# Patient Record
Sex: Male | Born: 1965 | Race: White | Hispanic: No | Marital: Married | Smoking: Never smoker
Health system: Southern US, Community
[De-identification: ages and names within clinical notes are randomized; demographics above are authoritative.]

## PROBLEM LIST (undated history)

## (undated) DIAGNOSIS — N289 Disorder of kidney and ureter, unspecified: Secondary | ICD-10-CM

## (undated) DIAGNOSIS — R7303 Prediabetes: Secondary | ICD-10-CM

## (undated) DIAGNOSIS — E785 Hyperlipidemia, unspecified: Secondary | ICD-10-CM

## (undated) DIAGNOSIS — T7840XA Allergy, unspecified, initial encounter: Secondary | ICD-10-CM

## (undated) DIAGNOSIS — E7800 Pure hypercholesterolemia, unspecified: Secondary | ICD-10-CM

## (undated) DIAGNOSIS — Z87891 Personal history of nicotine dependence: Secondary | ICD-10-CM

## (undated) DIAGNOSIS — I10 Essential (primary) hypertension: Secondary | ICD-10-CM

## (undated) HISTORY — DX: Allergy, unspecified, initial encounter: T78.40XA

## (undated) HISTORY — PX: HERNIA REPAIR: SHX51

## (undated) HISTORY — PX: TONSILLECTOMY: SUR1361

---

## 2012-01-21 ENCOUNTER — Emergency Department (HOSPITAL_COMMUNITY): Payer: 59

## 2012-01-21 ENCOUNTER — Other Ambulatory Visit: Payer: Self-pay

## 2012-01-21 ENCOUNTER — Emergency Department (HOSPITAL_COMMUNITY)
Admission: EM | Admit: 2012-01-21 | Discharge: 2012-01-21 | Disposition: A | Discharge status: Home / Self Care | Payer: 59 | Attending: Emergency Medicine | Admitting: Emergency Medicine

## 2012-01-21 DIAGNOSIS — R5381 Other malaise: Secondary | ICD-10-CM | POA: Insufficient documentation

## 2012-01-21 DIAGNOSIS — R110 Nausea: Secondary | ICD-10-CM

## 2012-01-21 DIAGNOSIS — R079 Chest pain, unspecified: Secondary | ICD-10-CM | POA: Insufficient documentation

## 2012-01-21 DIAGNOSIS — M25519 Pain in unspecified shoulder: Secondary | ICD-10-CM

## 2012-01-21 DIAGNOSIS — R5383 Other fatigue: Secondary | ICD-10-CM

## 2012-01-21 DIAGNOSIS — R209 Unspecified disturbances of skin sensation: Secondary | ICD-10-CM | POA: Insufficient documentation

## 2012-01-21 LAB — CBC
HCT: 48.6 % (ref 39.0–52.0)
Hemoglobin: 16.9 g/dL (ref 13.0–17.0)
MCHC: 34.8 g/dL (ref 30.0–36.0)
WBC: 8.3 10*3/uL (ref 4.0–10.5)

## 2012-01-21 LAB — BASIC METABOLIC PANEL
BUN: 10 mg/dL (ref 6–23)
Chloride: 103 mEq/L (ref 96–112)
GFR calc Af Amer: >90 mL/min (ref >90)
GFR calc non Af Amer: >90 mL/min (ref >90)
Potassium: 3.5 mEq/L (ref 3.5–5.1)
Sodium: 140 mEq/L (ref 135–145)

## 2012-01-21 LAB — DIFFERENTIAL
Basophils Absolute: 0 10*3/uL (ref 0.0–0.1)
Basophils Relative: 0 % (ref 0–1)
Lymphocytes Relative: 32 % (ref 12–46)
Monocytes Absolute: 0.5 10*3/uL (ref 0.1–1.0)
Monocytes Relative: 6 % (ref 3–12)
Neutro Abs: 5.1 10*3/uL (ref 1.7–7.7)
Neutrophils Relative %: 61 % (ref 43–77)

## 2012-01-21 LAB — TROPONIN I: Troponin I: <0.3 ng/mL (ref <0.30)

## 2012-01-21 MED ORDER — HYDROCODONE-ACETAMINOPHEN 5-325 MG PO TABS
1.0000 | ORAL_TABLET | Freq: Once | ORAL | Status: AC
  Administered 2012-01-21: 1 via ORAL
  Filled 2012-01-21: qty 1

## 2012-01-21 NOTE — ED Notes (Signed)
EKG handed to Dr. Jeraldine Loots.  Extra copy placed in pt chart.  No old EKG in [facility]

## 2012-01-21 NOTE — ED Notes (Signed)
Pt states that he has had left shoulder and chest pain since Saturday. Denies N/V and SOB. Pt received 324 of Asprin and 1 nitro en route. Pain was at a 2 prior to nitro and 0 after.

## 2012-01-21 NOTE — ED Notes (Signed)
MD notified of return of pain to pt's shoulder and neck.

## 2012-01-21 NOTE — ED Provider Notes (Signed)
History     CSN: [id number]  Arrival date & time 01/21/12  1632   First MD Initiated Contact with Patient 01/21/12 1634      Chief Complaint  Patient presents with  . Chest Pain    (Consider location/radiation/quality/duration/timing/severity/associated sxs/prior treatment) Patient is a 46 y.o. male presenting with shoulder pain. The history is provided by the patient.  Shoulder Pain This is a recurrent problem. The current episode started in the past 7 days (5 days ago). The problem occurs intermittently. The problem has been unchanged. Associated symptoms include fatigue (noticed today), nausea (today) and numbness (notices intermittent numbness and tingling of fingers with pain in shoulder starts). Pertinent negatives include no abdominal pain, chest pain, diaphoresis, fever, joint swelling, vomiting or weakness. The symptoms are aggravated by nothing. He has tried NSAIDs (pain improved with excedrin) for the symptoms. The treatment provided moderate relief.    No past medical history on file. HLD  No past surgical history on file.  No family history on file.  History  Substance Use Topics  . Smoking status: Not on file  . Smokeless tobacco: Not on file  . Alcohol Use: Not on file      Review of Systems  Constitutional: Positive for fatigue (noticed today). Negative for fever and diaphoresis.  Respiratory: Negative for shortness of breath.   Cardiovascular: Negative for chest pain.  Gastrointestinal: Positive for nausea (today). Negative for vomiting, abdominal pain, diarrhea and constipation.  Musculoskeletal: Negative for joint swelling.  Neurological: Positive for numbness (notices intermittent numbness and tingling of fingers with pain in shoulder starts). Negative for weakness.  All other systems reviewed and are negative.    Allergies  Review of patient's allergies indicates no known allergies.  Home Medications  No current outpatient prescriptions on  file.  BP 129/89  Pulse 81  Temp(Src) 98.3 F (36.8 C) (Oral)  Resp 17  SpO2 95%  Physical Exam  Nursing note and vitals reviewed. Constitutional: He is oriented to person, place, and time. He appears well-developed and well-nourished. No distress.  HENT:  Head: Normocephalic and atraumatic.  Right Ear: External ear normal.  Left Ear: External ear normal.  Mouth/Throat: Oropharynx is clear and moist.  Eyes: Pupils are equal, round, and reactive to light.  Neck: Normal range of motion. Neck supple.  Cardiovascular: Normal rate, regular rhythm, normal heart sounds and intact distal pulses.  Exam reveals no gallop and no friction rub.   No murmur heard. Pulmonary/Chest: Effort normal and breath sounds normal. No respiratory distress. He has no wheezes. He has no rales.  Abdominal: Soft. There is no tenderness. There is no rebound and no guarding.  Musculoskeletal: Normal range of motion. He exhibits tenderness. He exhibits no edema.       No swelling, erythema or warmth of left shoulder. TTP over superior aspect of left shoulder at trapezius. Mild TTP over left mid clavicle. Non tender nodule noted over left upper posterior trapezius. Full AROM of left shoulder with flex/extension abduction/adduction with mild discomfort.   Lymphadenopathy:    He has no cervical adenopathy.  Neurological: He is alert and oriented to person, place, and time.  Skin: Skin is warm and dry. No rash noted. No erythema.  Psychiatric: He has a normal mood and affect. His behavior is normal.    ED Course  Procedures (including critical care time)  Results for orders placed during the hospital encounter of 01/21/12  BASIC METABOLIC PANEL      Component Value Range  Sodium 140  135 - 145 (mEq/L)   Potassium 3.5  3.5 - 5.1 (mEq/L)   Chloride 103  96 - 112 (mEq/L)   CO2 28  19 - 32 (mEq/L)   Glucose, Bld 94  70 - 99 (mg/dL)   BUN 10  6 - 23 (mg/dL)   Creatinine, Ser 1.61  0.50 - 1.35 (mg/dL)   Calcium  9.4  8.4 - 10.5 (mg/dL)   GFR calc non Af Amer >90  >90 (mL/min)   GFR calc Af Amer >90  >90 (mL/min)  CBC      Component Value Range   WBC 8.3  4.0 - 10.5 (K/uL)   RBC 5.43  4.22 - 5.81 (MIL/uL)   Hemoglobin 16.9  13.0 - 17.0 (g/dL)   HCT 09.6  04.5 - 40.9 (%)   MCV 89.5  78.0 - 100.0 (fL)   MCH 31.1  26.0 - 34.0 (pg)   MCHC 34.8  30.0 - 36.0 (g/dL)   RDW 81.1  91.4 - 78.2 (%)   Platelets 244  150 - 400 (K/uL)  DIFFERENTIAL      Component Value Range   Neutrophils Relative 61  43 - 77 (%)   Neutro Abs 5.1  1.7 - 7.7 (K/uL)   Lymphocytes Relative 32  12 - 46 (%)   Lymphs Abs 2.7  0.7 - 4.0 (K/uL)   Monocytes Relative 6  3 - 12 (%)   Monocytes Absolute 0.5  0.1 - 1.0 (K/uL)   Eosinophils Relative 0  0 - 5 (%)   Eosinophils Absolute 0.0  0.0 - 0.7 (K/uL)   Basophils Relative 0  0 - 1 (%)   Basophils Absolute 0.0  0.0 - 0.1 (K/uL)  TROPONIN I      Component Value Range   Troponin I <0.30  <0.30 (ng/mL)  TROPONIN I      Component Value Range   Troponin I <0.30  <0.30 (ng/mL)    Dg Chest 2 View  01/21/2012  *RADIOLOGY REPORT*  Clinical Data: Chest pain.  CHEST - 2 VIEW  Comparison: None.  Findings: Heart size and vascularity are normal and the lungs are clear.  No osseous abnormality.  IMPRESSION: Normal chest.  Original Report Authenticated By: Gwynn Burly, M.D.   Imaging independently viewed by me, interpreted by radiologist.   Date: 01/21/2012  Rate: 81  Rhythm: normal sinus rhythm  QRS Axis: normal  Intervals: normal  ST/T Wave abnormalities: normal  Conduction Disutrbances:none  Narrative Interpretation:   Old EKG Reviewed: none available    1. Shoulder pain   2. Nausea   3. Fatigue       MDM  16:25 PM 46 year old male with a history of hyperlipidemia presenting with intermittent left shoulder pain that started 5 days ago with onset of nausea and fatigue today. 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 The next day he noticed left shoulder  pain. He took some Excedrin which improved the pain. The pain returned the next day for which he again he took Excedrin which helped, but he states this time the pain radiated into his right shoulder. The pain was then gone for the next 3 days until today when he states the pain in his left shoulder has been intermittent for most of the day with nausea and fatigue. The patient reports a long history of shoulder pain similar to this intermittently that he was told was due to a pinched nerve. He thought this might have been  the case 5 days ago since he was doing physical labor the day before, but the new symptoms of nausea and fatigue have him concerned this could be his heart. He is having no shortness of breath, vomiting or diaphoresis. He denies pain his chest. He states that he had a normal stress test several years ago. He Is not a smoker. He is adopted so is unaware of his family history. Although this is unlikely ACS, given that the patient is concerned this could be his heart will check CBC, BMP, chest x-ray and initial and three-hour troponin.  Labs are without significant abnormality. Initial and 3R troponin are negative. Chest x-ray is unremarkable. Patient has remained stable during his stay. Again this is very atypical for ACS as the patient has not had chest pain or shortness of breath along with pain that has been similar to his musculoskeletal pain in the past that is associated with recent physical labor. This is likely musculoskeletal. He will follow up with primary physician to discuss further diagnostics and question outpatient stress test. The patient is in agreement with this plan. He was given strong return precautions and discharged home in stable condition.      Sheran Luz, MD 01/22/12 [phone]

## 2012-01-21 NOTE — ED Notes (Signed)
EKG handed to Dr. Alto Denver, not Dr. Jeraldine Loots.

## 2012-01-23 NOTE — ED Provider Notes (Signed)
45 y.o.malewho presents with chest pain  CV: RRR, no m/r/g, no pitting edema of the lower extremities Pulm:CTAB, no c/w/r GI: SNTND, + BS, no guarding or rebound Skin: no rashes noted MSK: moves all 4 extremities symmetrically, no deformities or injuries noted Neuro: CN 2-12 intact, no abnormalities of strength or sensation, A and O x 3  I saw and evaluated the patient, reviewed the resident's note and I agree with the findings and plan.        Cyndra Numbers, MD 01/23/12 [phone]

## 2014-04-05 ENCOUNTER — Encounter (HOSPITAL_COMMUNITY): Payer: Self-pay | Admitting: Emergency Medicine

## 2014-04-05 ENCOUNTER — Emergency Department (HOSPITAL_COMMUNITY)
Admission: EM | Admit: 2014-04-05 | Discharge: 2014-04-05 | Disposition: A | Discharge status: Home / Self Care | Payer: 59 | Source: Home / Self Care | Attending: Family Medicine | Admitting: Family Medicine

## 2014-04-05 DIAGNOSIS — L259 Unspecified contact dermatitis, unspecified cause: Secondary | ICD-10-CM

## 2014-04-05 DIAGNOSIS — L309 Dermatitis, unspecified: Secondary | ICD-10-CM

## 2014-04-05 DIAGNOSIS — A46 Erysipelas: Secondary | ICD-10-CM

## 2014-04-05 HISTORY — DX: Pure hypercholesterolemia, unspecified: E78.00

## 2014-04-05 MED ORDER — LIDOCAINE HCL (PF) 1 % IJ SOLN
INTRAMUSCULAR | Status: AC
  Filled 2014-04-05: qty 5

## 2014-04-05 MED ORDER — CEFTRIAXONE SODIUM 1 G IJ SOLR
2.0000 g | Freq: Once | INTRAMUSCULAR | Status: AC
  Administered 2014-04-05: 2 g via INTRAMUSCULAR

## 2014-04-05 MED ORDER — CEFTRIAXONE SODIUM 1 G IJ SOLR
INTRAMUSCULAR | Status: AC
  Filled 2014-04-05: qty 20

## 2014-04-05 MED ORDER — MOMETASONE FUROATE 0.1 % EX CREA: 1.0000 "application " | TOPICAL_CREAM | Freq: Every day | CUTANEOUS | Status: DC

## 2014-04-05 MED ORDER — CEFUROXIME AXETIL 500 MG PO TABS: 500.0000 mg | ORAL_TABLET | Freq: Two times a day (BID) | ORAL | Status: DC

## 2014-04-05 NOTE — ED Notes (Signed)
Patient has redness to right arm, above elbow to mid forearm.  Right elbow is sore.  Red area itches. Dryness in elbow  Onset of this was one week ago.  Patient reports a history of smaller, splotchy areas that appear with certain foods, etc.

## 2014-04-05 NOTE — ED Provider Notes (Signed)
CSN: [id number]     Arrival date & time 04/05/14  8144 History   None    Chief Complaint  Patient presents with  . Rash   (Consider location/radiation/quality/duration/timing/severity/associated sxs/prior Treatment) HPI Comments: 48 year old male presents for evaluation of the sore, itchy, red place on his arm. This started about one week ago as an itchy spot on the right antecubital fossa. He scratched it, and since then he has had progressively worsening redness, and has some soreness of the arm now as well. he regularly gets some dry skin but he has never had this before. He denies any systemic symptoms. No numbness in the arm, no known injury. No fever. No similar rash elsewhere.  Patient is a 48 y.o. male presenting with rash.  Rash Associated symptoms: no fever     Past Medical History  Diagnosis Date  . High cholesterol    Past Surgical History  Procedure Laterality Date  . Tonsillectomy    . Hernia repair     No family history on file. History  Substance Use Topics  . Smoking status: Never Smoker   . Smokeless tobacco: Not on file  . Alcohol Use: Yes    Review of Systems  Constitutional: Negative for fever.  Musculoskeletal:       Some soreness in the right elbow  Skin: Positive for rash.  All other systems reviewed and are negative.   Allergies  Review of patient's allergies indicates no known allergies.  Home Medications   Prior to Admission medications   Medication Sig Start Date End Date Taking? Authorizing Provider  OVER THE COUNTER MEDICATION Take 2 packets by mouth daily. Core Mega Packets (Omega 3 supplement)    Historical Provider, MD  simvastatin (ZOCOR) 10 MG tablet Take 10 mg by mouth daily.    Historical Provider, MD   BP 157/107  Pulse 83  Temp(Src) 98.1 F (36.7 C) (Oral)  Resp 18  SpO2 96% Physical Exam  Nursing note and vitals reviewed. Constitutional: He is oriented to person, place, and time. He appears well-developed and  well-nourished. No distress.  HENT:  Head: Normocephalic.  Pulmonary/Chest: Effort normal. No respiratory distress.  Neurological: He is alert and oriented to person, place, and time. Coordination normal.  Skin: Skin is warm and dry. Rash noted. He is not diaphoretic.     Psychiatric: He has a normal mood and affect. Judgment normal.    ED Course  Procedures (including critical care time) Labs Review Labs Reviewed - No data to display  Imaging Review No results found.   MDM   1. Erysipelas   2. Eczema    Area outlined with a marker, he will go to the emergency room if worsening after 24 hours or if he starts to develop fever despite taking the antibiotics. Giving 2 g of Rocephin here and discharge with Ceftin. Also using a steroid ointment to help with symptoms.  Meds ordered this encounter  Medications  . cefTRIAXone (ROCEPHIN) injection 2 g    Sig:   . mometasone (ELOCON) 0.1 % cream    Sig: Apply 1 application topically daily.    Dispense:  50 g    Refill:  0    Order Specific Question:  Supervising Provider    Answer:  Lynne Leader, Rondo  . cefUROXime (CEFTIN) 500 MG tablet    Sig: Take 1 tablet (500 mg total) by mouth 2 (two) times daily with a meal.    Dispense:  20 tablet  Refill:  0    Order Specific Question:  Supervising Provider    Answer:  Lynne Leader, [place]       Liam Graham, PA-C 04/05/14 1045

## 2014-04-05 NOTE — Discharge Instructions (Signed)
Erysipelas  Erysipelas is a sudden form of cellulitis (inflammation of the cells) that affects the tissues near the skin surface. It is most often caused by a streptococcal or staphylococcal (germ) infection.  SYMPTOMS  Erysipelas begins as just not feeling well (malaise), chills, and a fever of usually 101° F (38.3° C) to 104° F (40° C). Being it is an inflammation (soreness) of the skin and the tissue just beneath the skin; it shows up as a reddened area with sharp borders. It may be streaked because the lymphatics are infected. These are lymph channels that flow out of your glands (lymph nodes), like the glands in your neck. The reddened area may be tender to touch with itching and burning of the skin. Sometimes this is accompanied by feelings of nausea (you are sick to your stomach) and vomiting (throwing up). Sometimes there may be a break in the skin over the reddened area which is where the bacteria (germs) entered the body. Sometimes there may not appear to be a site of entry. The most common area for erysipelas to appear is on the lower legs. When the legs are infected, it is usually the glands (lymph nodes) in the groin that may be enlarged and tender.  DIAGNOSIS   Your caregiver most often bases the diagnosis (learning what is wrong) on your physical findings (examination). It is often hard to grow the germs that produce this illness. Sometimes blood cultures (to see what germs may be growing in your blood) will be done if there is a high fever and the blood cultures are likely to be positive. This means the culture is able to grow the bacteria (germ) producing the erysipelas. If blood counts are done, the white blood count is usually elevated. The ESR (erythrocyte sedimentation rate) is also usually elevated (higher than normal). The ESR is just a nonspecific sign of infection being present.  TREATMENT   This infection usually responds rapidly to medications which kill germs (antibiotics). Depending on  findings and course of the illness (gets better or worse), your caregiver will be able to decide which is the best possible treatment for you. Most often these infections respond well to penicillin in individuals not allergic to penicillin. Other alternatives are available for those who cannot take penicillin.  HOME CARE INSTRUCTIONS   · You may return to work as directed.  · Only take over-the-counter or prescription medicines for pain, discomfort, or fever as directed by your caregiver.  · Finish all antibiotics as prescribed by your caregiver even if it looks as if the infection has cleared completely.  SEEK MEDICAL CARE IF:   · Your chills and feelings of illness are getting worse.  · You have pain or discomfort not controlled by medications, or if symptoms seem to be getting worse rather than better.  · The reddened area of infection seems to be spreading rather than getting smaller, red lines are extending away from the infection toward your chest or abdomen, or a part of the infection begins to turn dark in color.  · The problem returns in the same or another area after it seems to have gone away.  MAKE SURE YOU:   · Understand these instructions.  · Will watch your condition.  · Will get help right away if you are not doing well or get worse.  Document Released: 07/28/2001 Document Revised: 01/25/2012 Document Reviewed: 06/20/2008  [employer]® Patient Information ©2014 [employer].

## 2014-04-09 NOTE — ED Provider Notes (Signed)
Medical screening examination/treatment/procedure(s) were performed by a resident physician or non-physician practitioner and as the supervising physician I was immediately available for consultation/collaboration.  Yaiza Palazzola, MD    Courtney Fenlon S Mekisha Bittel, MD 04/09/14 0852 

## 2014-06-20 ENCOUNTER — Other Ambulatory Visit: Payer: Self-pay | Admitting: Family Medicine

## 2014-06-20 ENCOUNTER — Encounter: Payer: Self-pay | Admitting: Family Medicine

## 2014-06-20 ENCOUNTER — Ambulatory Visit (INDEPENDENT_AMBULATORY_CARE_PROVIDER_SITE_OTHER): Payer: 59 | Admitting: Family Medicine

## 2014-06-20 VITALS — BP 134/68 | HR 70 | Temp 99.2°F | Ht 67.0 in | Wt 202.0 lb

## 2014-06-20 DIAGNOSIS — E785 Hyperlipidemia, unspecified: Secondary | ICD-10-CM | POA: Insufficient documentation

## 2014-06-20 DIAGNOSIS — Z23 Encounter for immunization: Secondary | ICD-10-CM

## 2014-06-20 DIAGNOSIS — D225 Melanocytic nevi of trunk: Secondary | ICD-10-CM

## 2014-06-20 DIAGNOSIS — Z1211 Encounter for screening for malignant neoplasm of colon: Secondary | ICD-10-CM

## 2014-06-20 LAB — COMPREHENSIVE METABOLIC PANEL
ALT: 30 U/L (ref 0–53)
AST: 21 U/L (ref 0–37)
Albumin: 4.4 g/dL (ref 3.5–5.2)
Alkaline Phosphatase: 41 U/L (ref 39–117)
BUN: 10 mg/dL (ref 6–23)
CALCIUM: 9 mg/dL (ref 8.4–10.5)
CHLORIDE: 104 meq/L (ref 96–112)
CO2: 26 mEq/L (ref 19–32)
CREATININE: 0.91 mg/dL (ref 0.50–1.35)
GLUCOSE: 95 mg/dL (ref 70–99)
Potassium: 3.9 mEq/L (ref 3.5–5.3)
Sodium: 141 mEq/L (ref 135–145)
Total Bilirubin: 1.1 mg/dL (ref 0.2–1.2)
Total Protein: 6.7 g/dL (ref 6.0–8.3)

## 2014-06-20 LAB — CBC
HEMATOCRIT: 48.9 % (ref 39.0–52.0)
HEMOGLOBIN: 17.4 g/dL — AB (ref 13.0–17.0)
MCH: 30.7 pg (ref 26.0–34.0)
MCHC: 35.6 g/dL (ref 30.0–36.0)
MCV: 86.4 fL (ref 78.0–100.0)
Platelets: 240 10*3/uL (ref 150–400)
RBC: 5.66 MIL/uL (ref 4.22–5.81)
RDW: 13.6 % (ref 11.5–15.5)
WBC: 6.8 10*3/uL (ref 4.0–10.5)

## 2014-06-20 LAB — LIPID PANEL
CHOL/HDL RATIO: 4.3 ratio
CHOLESTEROL: 194 mg/dL (ref 0–200)
HDL: 45 mg/dL (ref >39)
LDL Cholesterol: 110 mg/dL — ABNORMAL HIGH (ref 0–99)
Triglycerides: 196 mg/dL — ABNORMAL HIGH (ref <150)
VLDL: 39 mg/dL (ref 0–40)

## 2014-06-20 LAB — POCT GLYCOSYLATED HEMOGLOBIN (HGB A1C): Hemoglobin A1C: 5.3

## 2014-06-20 MED ORDER — NAPROXEN SODIUM 220 MG PO TABS: 220.0000 mg | ORAL_TABLET | Freq: Two times a day (BID) | ORAL | Status: AC

## 2014-06-20 MED ORDER — TETANUS-DIPHTH-ACELL PERTUSSIS 5-2.5-18.5 LF-MCG/0.5 IM SUSP: 0.5000 mL | Freq: Once | INTRAMUSCULAR | Status: DC

## 2014-06-20 MED ORDER — LORATADINE-PSEUDOEPHEDRINE ER 10-240 MG PO TB24: 1.0000 | ORAL_TABLET | Freq: Every day | ORAL | Status: DC

## 2014-06-20 NOTE — Addendum Note (Signed)
Addended by: Corinna Capra on: 06/20/2014 11:18 AM   Modules accepted: Orders

## 2014-06-20 NOTE — Progress Notes (Signed)
Jared Michael is a 48 y.o. who presents today for establishing care.  Pt has PMHx of hyperlipidemia on Zocor 10 mg.  He was adopted so does not know his FHx.  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    Past Medical History  Diagnosis Date  . High cholesterol     History   Social History  . Marital Status: Married    Spouse Name: N/A    Number of Children: N/A  . Years of Education: N/A   Occupational History  . Not on file.   Social History Main Topics  . Smoking status: Never Smoker   . Smokeless tobacco: Not on file  . Alcohol Use: Yes  . Drug Use: No  . Sexual Activity: Not on file   Other Topics Concern  . Not on file   Social History Narrative  . No narrative on file    No family history on file.  Current Outpatient Prescriptions on File Prior to Visit  Medication Sig Dispense Refill  . cefUROXime (CEFTIN) 500 MG tablet Take 1 tablet (500 mg total) by mouth 2 (two) times daily with a meal.  20 tablet  0  . mometasone (ELOCON) 0.1 % cream Apply 1 application topically daily.  50 g  0  . OVER THE COUNTER MEDICATION Take 2 packets by mouth daily. Core Mega Packets (Omega 3 supplement)      . simvastatin (ZOCOR) 10 MG tablet Take 10 mg by mouth daily.       No current facility-administered medications on file prior to visit.    Patient Information Form: Screening and ROS  AUDIT-C Score: 3 Do you feel safe in relationships? Yes.   PHQ-2:negative  Review of Symptoms  General:  Negative for nexplained weight loss, fever Skin: Negative for new or changing mole, sore that won't heal HEENT: Negative for trouble hearing, trouble seeing, ringing in ears, mouth sores, hoarseness, change in voice, dysphagia. CV:  Negative for chest pain, dyspnea, edema, palpitations Resp: Negative for cough, dyspnea, hemoptysis GI: Negative for nausea, vomiting, diarrhea, constipation, abdominal pain, melena,  hematochezia. GU: Negative for dysuria, incontinence, urinary hesitance, hematuria, vaginal or penile discharge, polyuria, sexual difficulty, lumps in testicle or breasts MSK: Negative for muscle cramps or aches, joint pain or swelling Neuro: Negative for headaches, weakness, numbness, dizziness, passing out/fainting Psych: Negative for depression, anxiety, memory problems  Physical Exam Filed Vitals:   06/20/14 0843  BP: 134/68  Pulse: 70  Temp: 99.2 F (37.3 C)    Gen: NAD, Well nourished, Well developed HEENT: PERLA, EOMI, [place]/AT Neck: no JVD Cardio: RRR, No murmurs/gallops/rubs Lungs: CTA, no wheezes, rhonchi, crackles Abd: NABS, soft nontender nondistended MSK: ROM normal  Neuro: CN 2-12 intact, MS 5/5 B/L UE and LE, +2 patellar and achilles relfex b/l  Psych: AAO x 3     Chemistry      Component Value Date/Time   NA 140 01/21/2012 1652   K 3.5 01/21/2012 1652   CL 103 01/21/2012 1652   CO2 28 01/21/2012 1652   BUN 10 01/21/2012 1652   CREATININE 0.89 01/21/2012 1652      Component Value Date/Time   CALCIUM 9.4 01/21/2012 1652      Lab Results  Component Value Date   WBC 8.3 01/21/2012   HGB 16.9 01/21/2012   HCT 48.6 01/21/2012   MCV 89.5 01/21/2012   PLT 244 01/21/2012  No results found for this basename: TSH   No results found for this basename: HGBA1C

## 2014-06-20 NOTE — Patient Instructions (Signed)
Mr. Nodarse, it was nice meeting you today.  Please call for your colonoscopy sometime in the next 1-2 weeks.  We will see you back in 4-6 weeks to discuss your lab results.  Thanks Dr. Awanda Mink

## 2014-06-20 NOTE — Assessment & Plan Note (Signed)
Information given for screening colonoscopy, discussed at length benefit of having this done.  Pt agreeable to this and will f/u in 6-8 weeks.

## 2014-06-20 NOTE — Assessment & Plan Note (Signed)
Check Lipid Panel, CMET, A1C, Micro:Creat, CBC today.

## 2014-06-21 LAB — MICROALBUMIN / CREATININE URINE RATIO
CREATININE, URINE: 224.1 mg/dL
MICROALB UR: 1.06 mg/dL (ref 0.00–1.89)
MICROALB/CREAT RATIO: 4.7 mg/g (ref 0.0–30.0)

## 2014-07-18 ENCOUNTER — Ambulatory Visit (INDEPENDENT_AMBULATORY_CARE_PROVIDER_SITE_OTHER): Payer: 59 | Admitting: Family Medicine

## 2014-07-18 ENCOUNTER — Encounter: Payer: Self-pay | Admitting: Family Medicine

## 2014-07-18 VITALS — BP 136/89 | HR 100 | Temp 97.6°F | Ht 67.0 in | Wt 203.9 lb

## 2014-07-18 DIAGNOSIS — E785 Hyperlipidemia, unspecified: Secondary | ICD-10-CM

## 2014-07-18 DIAGNOSIS — D582 Other hemoglobinopathies: Secondary | ICD-10-CM

## 2014-07-18 NOTE — Assessment & Plan Note (Signed)
Elevated to 17.5 on most recent CBC and previously around 17.  Slightly high in non smoker or previous smoker.  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  Would consider recheck in 6-12 months, and if still elevated, perform a peripheral smear and possible EPO levels with referral to hematology for further w/u including possible JAK-2 eval.

## 2014-07-18 NOTE — Progress Notes (Signed)
Jared Michael is a 48 y.o. who presents today for lab f/u.  Pt has PMHx of hyperlipidemia on Zocor 10 mg.  Lab work appropriate with slightly elevated cholesterol.  Hgb - 17.4, denies smoking, has slightly ruddy face.  Occasional HA but denies pruritis, diplopia, visual changes, or hx of clot.  Past Medical History  Diagnosis Date  . High cholesterol   . Allergy     History   Social History  . Marital Status: Married    Spouse Name: N/A    Number of Children: N/A  . Years of Education: N/A   Occupational History  . Media planner for [employer]    Social History Main Topics  . Smoking status: Never Smoker   . Smokeless tobacco: Never Used  . Alcohol Use: 2.4 oz/week    4 Cans of beer per week  . Drug Use: No  . Sexual Activity: Yes   Other Topics Concern  . Not on file   Social History Narrative   Pt works as Customer service manager for [employer]     No family history on file.  Current Outpatient Prescriptions on File Prior to Visit  Medication Sig Dispense Refill  . loratadine-pseudoephedrine (CLARITIN-D 24 HOUR) 10-240 MG per 24 hr tablet Take 1 tablet by mouth daily.      . naproxen sodium (ALEVE) 220 MG tablet Take 1 tablet (220 mg total) by mouth 2 (two) times daily with a meal.      . OVER THE COUNTER MEDICATION Take 2 packets by mouth daily. Core Mega Packets (Omega 3 supplement)      . simvastatin (ZOCOR) 10 MG tablet Take 10 mg by mouth daily.       No current facility-administered medications on file prior to visit.     Physical Exam Filed Vitals:   07/18/14 0859  BP: 136/89  Pulse: 100  Temp: 97.6 F (36.4 C)    Gen: NAD, Well nourished, Well developed HEENT: PERLA, EOMI, [place]/AT Neck: no JVD Cardio: RRR, No murmurs/gallops/rubs Lungs: CTA, no wheezes, rhonchi, crackles Abd: NABS, soft nontender nondistended     Chemistry      Component Value Date/Time   NA 141 06/20/2014 0926   K 3.9 06/20/2014 0926   CL 104 06/20/2014 0926   CO2 26 06/20/2014 0926   BUN  10 06/20/2014 0926   CREATININE 0.91 06/20/2014 0926   CREATININE 0.89 01/21/2012 1652      Component Value Date/Time   CALCIUM 9.0 06/20/2014 0926   ALKPHOS 41 06/20/2014 0926   AST 21 06/20/2014 0926   ALT 30 06/20/2014 0926   BILITOT 1.1 06/20/2014 0926      Lab Results  Component Value Date   WBC 6.8 06/20/2014   HGB 17.4* 06/20/2014   HCT 48.9 06/20/2014   MCV 86.4 06/20/2014   PLT 240 06/20/2014   No results found for this basename: TSH   Lab Results  Component Value Date   HGBA1C 5.3 06/20/2014

## 2014-07-18 NOTE — Assessment & Plan Note (Signed)
Lab results reviewed, discussed option of stopping Zocor and managing with diet and exercise.  Pt will decide what to do and let me know.

## 2015-06-05 ENCOUNTER — Ambulatory Visit (INDEPENDENT_AMBULATORY_CARE_PROVIDER_SITE_OTHER): Payer: 59 | Admitting: Family Medicine

## 2015-06-05 ENCOUNTER — Telehealth: Payer: Self-pay | Admitting: Family Medicine

## 2015-06-05 ENCOUNTER — Encounter: Payer: Self-pay | Admitting: Family Medicine

## 2015-06-05 VITALS — BP 151/101 | HR 93 | Temp 98.3°F | Wt 207.0 lb

## 2015-06-05 DIAGNOSIS — R030 Elevated blood-pressure reading, without diagnosis of hypertension: Secondary | ICD-10-CM

## 2015-06-05 DIAGNOSIS — N4889 Other specified disorders of penis: Secondary | ICD-10-CM | POA: Diagnosis not present

## 2015-06-05 DIAGNOSIS — IMO0001 Reserved for inherently not codable concepts without codable children: Secondary | ICD-10-CM

## 2015-06-05 LAB — POCT UA - MICROSCOPIC ONLY

## 2015-06-05 LAB — POCT URINALYSIS DIPSTICK
BILIRUBIN UA: NEGATIVE
Glucose, UA: NEGATIVE
KETONES UA: NEGATIVE
LEUKOCYTES UA: NEGATIVE
Nitrite, UA: NEGATIVE
Protein, UA: NEGATIVE
SPEC GRAV UA: 1.02
UROBILINOGEN UA: 0.2
pH, UA: 7.5

## 2015-06-05 NOTE — Patient Instructions (Signed)
I will call you with your urine test results this afternoon We'll most likely refer you to a urologist Please come in to see Dr. Bonner Puna in the next few weeks to have your blood pressure rechecked since it was high today.  Be well, Dr. Ardelia Mems

## 2015-06-05 NOTE — Telephone Encounter (Signed)
Pt returned call. Discussed urine micro result. Recommend referral to urology. Order entered. Encouraged pt to come in to see [place] again if any worsening before his urology appointment.  Leeanne Rio, MD

## 2015-06-05 NOTE — Telephone Encounter (Signed)
Attempted to reach pt to discuss UA results. Left voicemail asking pt to call us. When pt returns call please page me at [phone] and I will speak with him.  Thanks, Leeanne Rio, MD

## 2015-06-05 NOTE — Assessment & Plan Note (Signed)
Elevated BP noted today. Encouraged pt to f/u with PCP for BP recheck and routine health maintenance.

## 2015-06-05 NOTE — Assessment & Plan Note (Signed)
No abnormalities on exam today, including prostate exam. Urinalysis significant for trace blood. Micro pending at end of visit, but does now show occasional RBC and few bacteria but white cells to suggest UTI. Etiology of pain is not clear. Will refer to urology for further evaluation.

## 2015-06-05 NOTE — Progress Notes (Signed)
Patient ID: Jared Michael, male   DOB: Jul 21, 1966, 49 y.o.   MRN: [id number]  HPI:  Pt presents for a same day appointment to discuss penile pain.   Has had pain on underside of penis for appx 3 weeks. Denies dysuria, fevers. No lesions seen on penis. Sexually active with his wife, no other partners recently. Has been appx one month since their last sexual intercourse. She has not noticed any pelvic symptoms. The pain has slightly and gradually migrated to his left testicle but he still has the pain on his penis. 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 The pain in penis only occurs during the day and does not bother him at night. It is intermittent. Stooling normally. Has tried aleve without relief.  ROS: See HPI  [place]: hx HLD, elevated hemoglobin  PHYSICAL EXAM: BP 151/101 mmHg  Pulse 93  Temp(Src) 98.3 F (36.8 C) (Oral)  Wt 207 lb (93.895 kg) Gen: NAD GU: normal circumcised penis without lesions. nontender to palpation.  Testes nontender to palpation. Scrotum normal. No inguinal hernias or lymphadenopathy.  Rectal: Normal rectal tone. Tip of prostate barely palpable and is nontender to palpation. No gross rectal blood.  ASSESSMENT/PLAN:  Penile pain No abnormalities on exam today, including prostate exam. Urinalysis significant for trace blood. Micro pending at end of visit, but does now show occasional RBC and few bacteria but white cells to suggest UTI. Etiology of pain is not clear. Will refer to urology for further evaluation.  Elevated blood pressure Elevated BP noted today. Encouraged pt to f/u with PCP for BP recheck and routine health maintenance.    FOLLOW UP: F/u in next several weeks for BP recheck Referring to urology.  [place]. Ardelia Mems, [place]

## 2016-12-17 ENCOUNTER — Other Ambulatory Visit: Payer: Self-pay | Admitting: Family Medicine

## 2016-12-17 ENCOUNTER — Ambulatory Visit (INDEPENDENT_AMBULATORY_CARE_PROVIDER_SITE_OTHER): Payer: 59 | Admitting: Family Medicine

## 2016-12-17 ENCOUNTER — Encounter: Payer: Self-pay | Admitting: Family Medicine

## 2016-12-17 ENCOUNTER — Telehealth: Payer: Self-pay | Admitting: *Deleted

## 2016-12-17 VITALS — BP 140/92 | HR 79 | Temp 98.8°F | Ht 67.0 in | Wt 204.8 lb

## 2016-12-17 DIAGNOSIS — R42 Dizziness and giddiness: Secondary | ICD-10-CM

## 2016-12-17 DIAGNOSIS — I10 Essential (primary) hypertension: Secondary | ICD-10-CM

## 2016-12-17 MED ORDER — HYDROCHLOROTHIAZIDE 25 MG PO TABS: 12.5000 mg | ORAL_TABLET | Freq: Every day | ORAL | 0 refills | Status: DC

## 2016-12-17 NOTE — Progress Notes (Signed)
   Subjective: CC: dizziness [id number] Jared Michael is a 51 y.o. male presenting to clinic today for same day appointment. PCP: Mercy Riding, MD Concerns today include:  1. Dizziness Patient reports that dizziness started 2 Sundays ago.  He notes that dizziness feels like "beer buzz".  He denies vertigo, LOC.   He reports that his symptoms are intermittent.  He reports that when the symptoms first started he was ambulating from the bathroom after a BM.  He describes symptoms as a sensation of imbalance.  He reports that symptoms resolved after he returned to bed but that they recurred on Tues while he was seated.  He denies visual disturbance, falls, nausea, vomiting, confusion, weakness, numbness/ tingling.  He has done nothing so far for symptoms.    Additionally, he reports left sided neck discomfort that occurred after working out at [employer], which he reports he goes to once per month.  He denies UE weakness or numbness.  He reports that neck pain is intermittent and associated with workouts.  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  He never has dizziness spells with this activity.  He reports eating a lot of snack/ prepackaged foods.  He does not routinely add salt to his meals.  No Known Allergies  Social Hx reviewed. MedHx, current medications and allergies reviewed.  Please see EMR. ROS: Per HPI  Objective: Office vital signs reviewed. BP (!) 140/92   Pulse 79   Temp 98.8 F (37.1 C) (Oral)   Ht 5\' 7"  (1.702 m)   Wt 204 lb 12.8 oz (92.9 kg)   SpO2 97%   BMI 32.08 kg/m   Physical Examination:  General: Awake, alert, well nourished, No acute distress HEENT: Normal    Neck: No masses palpated. No lymphadenopathy    Ears: right ear with soft cerumen occluding the TM, no erythema    Eyes: PERRLA, EOMI    Nose: nasal turbinates moist, no nasal discharge    Throat: moist mucus membranes, no erythema,  no tonsillar exudate.  Airway is patent Cardio: regular rate and rhythm, S1S2 heard, no murmurs appreciated Pulm: clear to auscultation bilaterally, no wheezes, rhonchi or rales; normal work of breathing on room air Neuro: 5/5 UE/ LE Strength and light touch sensation grossly intact, CN 2-12 grossly in tact  Orthostatic VS for the past 24 hrs:  BP- Lying Pulse- Lying BP- Sitting Pulse- Sitting BP- Standing at 0 minutes Pulse- Standing at 0 minutes  12/17/16 0947 (!) 142/100 82 (!) 132/98 81 (!) 140/100 79    Assessment/ Plan: 51 y.o. male   1. Dizziness.  Orthostatics negative.  I suspect that dizziness may be related to hypertension vs postmictural/ postBM hypotension.  Will treat for hypertension as below.  No red flags on exam.  Grossly normal neuro and cardiac exam. - Basic Metabolic Panel; Future ([facility]) - Strict return precautions reviewed  2. Essential hypertension - BMP - hydrochlorothiazide (HYDRODIURIL) 25 MG tablet; Take 0.5 tablets (12.5 mg total) by mouth daily.  Dispense: 30 tablet; Refill: 0 - Side effects discussed with patient - Patient to continue lifestyle changes.  DASH diet recommended. - Patient to follow up in 1 week for BP check and symptoms check.  Janora Norlander, DO PGY-3, [facility]

## 2016-12-17 NOTE — Telephone Encounter (Signed)
Contacted pt to remind him to go to [place] to have labs drawn also faxed order over to [facility] so they would have it when pt goes to have this drawn. Katharina Caper, April D, [place]

## 2016-12-17 NOTE — Patient Instructions (Addendum)
Plan to follow up in 1 week for BP check and symptom check.  Hypertension Hypertension, commonly called high blood pressure, is when the force of blood pumping through your arteries is too strong. Your arteries are the blood vessels that carry blood from your heart throughout your body. A blood pressure reading consists of a higher number over a lower number, such as 110/72. The higher number (systolic) is the pressure inside your arteries when your heart pumps. The lower number (diastolic) is the pressure inside your arteries when your heart relaxes. Ideally you want your blood pressure below 120/80. Hypertension forces your heart to work harder to pump blood. Your arteries may become narrow or stiff. Having untreated or uncontrolled hypertension can cause heart attack, stroke, kidney disease, and other problems. What increases the risk? Some risk factors for high blood pressure are controllable. Others are not. Risk factors you cannot control include:  Race. You may be at higher risk if you are African American.  Age. Risk increases with age.  Gender. Men are at higher risk than women before age 5 years. After age 33, women are at higher risk than men. Risk factors you can control include:  Not getting enough exercise or physical activity.  Being overweight.  Getting too much fat, sugar, calories, or salt in your diet.  Drinking too much alcohol. What are the signs or symptoms? Hypertension does not usually cause signs or symptoms. Extremely high blood pressure (hypertensive crisis) may cause headache, anxiety, shortness of breath, and nosebleed. How is this diagnosed? To check if you have hypertension, your health care provider will measure your blood pressure while you are seated, with your arm held at the level of your heart. It should be measured at least twice using the same arm. Certain conditions can cause a difference in blood pressure between your right and left arms. A blood  pressure reading that is higher than normal on one occasion does not mean that you need treatment. If it is not clear whether you have high blood pressure, you may be asked to return on a different day to have your blood pressure checked again. Or, you may be asked to monitor your blood pressure at home for 1 or more weeks. How is this treated? Treating high blood pressure includes making lifestyle changes and possibly taking medicine. Living a healthy lifestyle can help lower high blood pressure. You may need to change some of your habits. Lifestyle changes may include:  Following the DASH diet. This diet is high in fruits, vegetables, and whole grains. It is low in salt, red meat, and added sugars.  Keep your sodium intake below 2,300 mg per day.  Getting at least 30-45 minutes of aerobic exercise at least 4 times per week.  Losing weight if necessary.  Not smoking.  Limiting alcoholic beverages.  Learning ways to reduce stress. Your health care provider may prescribe medicine if lifestyle changes are not enough to get your blood pressure under control, and if one of the following is true:  You are 59-83 years of age and your systolic blood pressure is above 140.  You are 81 years of age or older, and your systolic blood pressure is above 150.  Your diastolic blood pressure is above 90.  You have diabetes, and your systolic blood pressure is over [id number] or your diastolic blood pressure is over 90.  You have kidney disease and your blood pressure is above 140/90.  You have heart disease and your blood pressure  is above 140/90. Your personal target blood pressure may vary depending on your medical conditions, your age, and other factors. Follow these instructions at home:  Have your blood pressure rechecked as directed by your health care provider.  Take medicines only as directed by your health care provider. Follow the directions carefully. Blood pressure medicines must be taken as  prescribed. The medicine does not work as well when you skip doses. Skipping doses also puts you at risk for problems.  Do not smoke.  Monitor your blood pressure at home as directed by your health care provider. Contact a health care provider if:  You think you are having a reaction to medicines taken.  You have recurrent headaches or feel dizzy.  You have swelling in your ankles.  You have trouble with your vision. Get help right away if:  You develop a severe headache or confusion.  You have unusual weakness, numbness, or feel faint.  You have severe chest or abdominal pain.  You vomit repeatedly.  You have trouble breathing. This information is not intended to replace advice given to you by your health care provider. Make sure you discuss any questions you have with your health care provider. Document Released: 11/02/2005 Document Revised: 04/09/2016 Document Reviewed: 08/25/2013 Elsevier Interactive Patient Education  2017 Elsevier Inc.   Dizziness Dizziness is a common problem. It is a feeling of unsteadiness or light-headedness. You may feel like you are about to faint. Dizziness can lead to injury if you stumble or fall. Anyone can become dizzy, but dizziness is more common in older adults. This condition can be caused by a number of things, including medicines, dehydration, or illness. Follow these instructions at home: Taking these steps may help with your condition: Eating and drinking  Drink enough fluid to keep your urine clear or pale yellow. This helps to keep you from becoming dehydrated. Try to drink more clear fluids, such as water.  Do not drink alcohol.  Limit your caffeine intake if directed by your health care provider.  Limit your salt intake if directed by your health care provider. Activity  Avoid making quick movements.  Rise slowly from chairs and steady yourself until you feel okay.  In the morning, first sit up on the side of the bed.  When you feel okay, stand slowly while you hold onto something until you know that your balance is fine.  Move your legs often if you need to stand in one place for a long time. Tighten and relax your muscles in your legs while you are standing.  Do not drive or operate heavy machinery if you feel dizzy.  Avoid bending down if you feel dizzy. Place items in your home so that they are easy for you to reach without leaning over. Lifestyle  Do not use any tobacco products, including cigarettes, chewing tobacco, or electronic cigarettes. If you need help quitting, ask your health care provider.  Try to reduce your stress level, such as with yoga or meditation. Talk with your health care provider if you need help. General instructions  Watch your dizziness for any changes.  Take medicines only as directed by your health care provider. Talk with your health care provider if you think that your dizziness is caused by a medicine that you are taking.  Tell a friend or a family member that you are feeling dizzy. If he or she notices any changes in your behavior, have this person call your health care provider.  Keep all  follow-up visits as directed by your health care provider. This is important. Contact a health care provider if:  Your dizziness does not go away.  Your dizziness or light-headedness gets worse.  You feel nauseous.  You have reduced hearing.  You have new symptoms.  You are unsteady on your feet or you feel like the room is spinning. Get help right away if:  You vomit or have diarrhea and are unable to eat or drink anything.  You have problems talking, walking, swallowing, or using your arms, hands, or legs.  You feel generally weak.  You are not thinking clearly or you have trouble forming sentences. It may take a friend or family member to notice this.  You have chest pain, abdominal pain, shortness of breath, or sweating.  Your vision changes.  You notice any  bleeding.  You have a headache.  You have neck pain or a stiff neck.  You have a fever. This information is not intended to replace advice given to you by your health care provider. Make sure you discuss any questions you have with your health care provider. Document Released: 04/28/2001 Document Revised: 04/09/2016 Document Reviewed: 10/29/2014 Elsevier Interactive Patient Education  2017 [employer].

## 2016-12-28 ENCOUNTER — Encounter: Payer: Self-pay | Admitting: Student

## 2016-12-30 ENCOUNTER — Other Ambulatory Visit: Payer: Self-pay | Admitting: Family Medicine

## 2016-12-30 ENCOUNTER — Ambulatory Visit (INDEPENDENT_AMBULATORY_CARE_PROVIDER_SITE_OTHER): Payer: 59 | Admitting: Student

## 2016-12-30 ENCOUNTER — Encounter: Payer: Self-pay | Admitting: Student

## 2016-12-30 VITALS — Ht 67.0 in | Wt 202.0 lb

## 2016-12-30 DIAGNOSIS — I10 Essential (primary) hypertension: Secondary | ICD-10-CM

## 2016-12-30 DIAGNOSIS — Z1159 Encounter for screening for other viral diseases: Secondary | ICD-10-CM | POA: Diagnosis not present

## 2016-12-30 DIAGNOSIS — Z114 Encounter for screening for human immunodeficiency virus [HIV]: Secondary | ICD-10-CM | POA: Diagnosis not present

## 2016-12-30 DIAGNOSIS — R42 Dizziness and giddiness: Secondary | ICD-10-CM | POA: Diagnosis not present

## 2016-12-30 MED ORDER — HYDROCHLOROTHIAZIDE 25 MG PO TABS: 25.0000 mg | ORAL_TABLET | Freq: Every day | ORAL | 0 refills | Status: DC

## 2016-12-30 NOTE — Patient Instructions (Signed)
It was great seeing you today! We have addressed the following issues today  1. Blood pressure: Your blood pressure is 140/100. Your goal blood pressure is later 140/90. I have increased the hydrochlorothiazide to 25 mg daily. Please let us know if you have dizziness or other symptoms. Otherwise, we would like to see her back in one month.   If we did any lab work today, and the results require attention, either me or my nurse will get in touch with you. If everything is normal, you will get a letter in mail. If you don't hear from [place] in two weeks, please give [place] a call. Otherwise, we look forward to seeing you again at your next visit. If you have any questions or concerns before then, please call the clinic at [phone].   Please bring all your medications to every doctors visit   Sign up for My Chart to have easy access to your labs results, and communication with your Primary care physician.     Please check-out at the front desk before leaving the clinic.    Vasovagal Syncope, Adult Syncope, which is commonly known as fainting or passing out, is a temporary loss of consciousness. It occurs when the blood flow to the brain is reduced. Vasovagal syncope, also called neurocardiogenic syncope, is a fainting spell that happens when blood flow to the brain is reduced because of a sudden drop in heart rate and blood pressure. Vasovagal syncope is usually harmless. However, you can get injured if you fall during a fainting spell. What are the causes? This condition is caused by a drop in heart rate and blood pressure, usually in response to a trigger. Many things and situations can trigger an episode, including:  Pain.  Fear.  The sight of blood. This may occur during medical procedures, such as when blood is being drawn from a vein.  Common activities, such as coughing, swallowing, stretching, or going to the bathroom.  Emotional stress.  Being in a confined space.  Prolonged  standing, especially in a warm environment.  Lack of sleep or rest.  Not eating for a long time.  Not drinking enough liquids.  Recent illness.  Drinking alcohol.  Taking drugs that affect blood pressure, such as marijuana, cocaine, opiates, or inhalants. What are the signs or symptoms? Before a fainting episode, you may:  Feel dizzy or light-headed.  Become pale.  Sense that you are going to faint.  Feel like the room is spinning.  Only see directly ahead (tunnel vision).  Feel sick to your stomach (nauseous).  See spots.  Slowly lose vision.  Hear ringing in your ears.  Have a headache.  Feel warm and sweaty.  Feel a sensation of pins and needles. During the fainting spell, you may twitch or make jerky movements. Fainting spells usually last no longer than a few minutes before you wake up. If you get up too quickly before your body can recover, you may faint again. How is this diagnosed? This condition is diagnosed based on your symptoms, your medical history, and a physical exam. Tests may be done to rule out other causes of fainting. Tests may include:  Blood tests.  Heart tests, such as an electrocardiogram (ECG), echocardiogram, or electrophysiology study.  A test to check your response to changes in position (tilt table test). How is this treated? Usually, treatment is not needed for this condition. Your health care provider may suggest ways to help prevent fainting episodes. These may include:  Drinking additional fluids if you are exposed to a trigger.  Sitting or lying down if you notice signs that an episode is coming. If your fainting spells continue, your health care provider may recommend that you:  Take medicines to prevent fainting or to help reduce further episodes of fainting.  Do certain exercises.  Wear compression stockings.  Have surgery to place a pacemaker in your body (rare). Follow these instructions at home:  Learn to  identify the signs that an episode is coming.  Sit or lie down at the first sign of a fainting spell. If you sit down, put your head down between your legs. If you lie down, swing your legs up in the air to increase blood flow to the brain.  Avoid hot tubs and saunas.  Avoid standing for a long time. If you have to stand for a long time, try:  Crossing your legs.  Flexing and stretching your leg muscles.  Squatting.  Moving your legs.  Bending over.  Drink enough fluid to keep your urine clear or pale yellow.  Make changes to your diet that your health care provider recommends. You may be told to:  Avoid caffeine.  Eat more salt.  Take over-the-counter and prescription medicines only as told by your health care provider. Contact a health care provider if:  You continue to have fainting spells despite treatment.  You faint more often despite treatment.  You lose consciousness for more than a few minutes.  You faint during or after exercising or after being startled.  You have twitching or jerky movements for longer than a few seconds during a fainting spell.  You have an episode of twitching or jerky movements without fainting. Get help right away if:  A fainting spell leads to an injury or bleeding.  You have new symptoms that occur with the fainting spells, such as:  Shortness of breath.  Chest pain.  Irregular heartbeat.  You twitch or make jerky movements for more than 5 minutes.  You twitch or make jerky movements during more than one fainting spell. This information is not intended to replace advice given to you by your health care provider. Make sure you discuss any questions you have with your health care provider. Document Released: 10/19/2012 Document Revised: 04/15/2016 Document Reviewed: 08/31/2015 Elsevier Interactive Patient Education  2017 [employer].

## 2016-12-30 NOTE — Progress Notes (Signed)
  Subjective:    Jared Michael is a 51 y.o. old male here for dizziness and follow-up on hypertension  HPI Dizziness: happened three weeks ago. He used a bathroom and got up and washed his hand. When he was ready to get out of the bathroom, he felt a strong sense of imbalance. Denies LOC or fall. He hasn't had similar symptoms since then. Denies palpiation, chest pain. Felt shortness of breath that week but not any more. Not sick at that. Went to gym and had 40 minutes work out before the incident. It was the first time in two months.   HTN: Started on hydrochlorothiazide 12.5 mg about 10 days ago. Tolerating his medications well. Not checking his blood pressure at home. Denies dizziness since starting medication  PMH/Problem List: has Hyperlipidemia; Encounter for screening colonoscopy; Elevated hemoglobin ([address]); Penile pain; Essential hypertension; and Dizziness on his problem list.   has a past medical history of Allergy and High cholesterol.  FH:  No family history on file.  [place] Social History  Substance Use Topics  . Smoking status: Never Smoker  . Smokeless tobacco: Never Used  . Alcohol use 2.4 oz/week    4 Cans of beer per week    Review of Systems Review of systems negative except for pertinent positives and negatives in history of present illness above.     Objective:     Vitals:   12/30/16 1107  SpO2: 97%  Weight: 202 lb (91.6 kg)  Height: 5\' 7"  (1.702 m)   Orthostatic vitals: Lying: 140/102 Sitting: 150/100 Standing: 150/100  Repeat blood pressure after encounter: 140/100  Physical Exam GEN: appears well, no apparent distress. HEM: negative for cervical or periauricular lymphadenopathies CVS: RRR, nl S1&S2, no murmurs, no edema, cap refills < 2 sec RESP: speaks in full sentence, no IWOB, CTAB GI: BS present & normal, soft, NTND, no guarding MSK: no focal tenderness or notable swelling SKIN: no apparent skin lesion NEURO: alert and oiented appropriately, no  gross defecits  PSYCH: euthymic mood with congruent affect    Assessment and Plan:  Essential hypertension BP still elevated. Started HCTZ at 12.5 mg about 10 days ago. Tolerating this well. Denies side effects.   -Increased his HCTZ to 25 mg daily.  -BMP today.  -Follow up in one month  Dizziness This is a one time episode about three weeks ago. Likely causes are vasovagal (after bathroom use) and/or dehydration (after workout). Orthostatic vital negative. Doubt arrhythmia in patient with no significant risk factor. Will continue managing his BP well.      Orders Placed This Encounter  Procedures  . Basic Metabolic Panel    Standing Status:   Future    Standing Expiration Date:   12/30/2017  . HIV antibody (with reflex)    Standing Status:   Future    Standing Expiration Date:   12/30/2017  . Hepatitis c antibody (reflex)    Standing Status:   Future    Standing Expiration Date:   12/30/2017    Return in about 1 month (around 01/27/2017) for HTN.  Mercy Riding, MD 01/01/17 Pager: [phone]

## 2017-01-01 DIAGNOSIS — R42 Dizziness and giddiness: Secondary | ICD-10-CM | POA: Insufficient documentation

## 2017-01-01 NOTE — Assessment & Plan Note (Addendum)
This is a one time episode about three weeks ago. Likely causes are vasovagal (after bathroom use) and/or dehydration (after workout). Orthostatic vital negative. Doubt arrhythmia in patient with no significant risk factor. Will continue managing his BP well.

## 2017-01-01 NOTE — Assessment & Plan Note (Addendum)
BP still elevated. Started HCTZ at 12.5 mg about 10 days ago. Tolerating this well. Denies side effects.   -Increased his HCTZ to 25 mg daily.  -BMP today.  -Follow up in one month

## 2017-01-11 ENCOUNTER — Encounter: Payer: Self-pay | Admitting: Student

## 2017-01-14 LAB — BASIC METABOLIC PANEL
BUN/Creatinine Ratio: 12 (ref 9–20)
BUN/Creatinine Ratio: 13 (ref 9–20)
BUN: 11 mg/dL (ref 6–24)
BUN: 11 mg/dL (ref 6–24)
CHLORIDE: 100 mmol/L (ref 96–106)
CHLORIDE: 97 mmol/L (ref 96–106)
CO2: 26 mmol/L (ref 18–29)
CO2: 27 mmol/L (ref 18–29)
CREATININE: 0.92 mg/dL (ref 0.76–1.27)
CREATININE: 0.86 mg/dL (ref 0.76–1.27)
Calcium: 9.5 mg/dL (ref 8.7–10.2)
Calcium: 9.4 mg/dL (ref 8.7–10.2)
GFR calc Af Amer: 110 mL/min/{1.73_m2} (ref >59)
GFR calc Af Amer: 115 mL/min/{1.73_m2} (ref >59)
GFR calc non Af Amer: 95 mL/min/{1.73_m2} (ref >59)
GFR calc non Af Amer: 100 mL/min/{1.73_m2} (ref >59)
GLUCOSE: 97 mg/dL (ref 65–99)
Glucose: 86 mg/dL (ref 65–99)
Potassium: 4 mmol/L (ref 3.5–5.2)
Potassium: 4.5 mmol/L (ref 3.5–5.2)
Sodium: 143 mmol/L (ref 134–144)
Sodium: 144 mmol/L (ref 134–144)

## 2017-01-14 LAB — HIV ANTIBODY (ROUTINE TESTING W REFLEX): HIV SCREEN 4TH GENERATION: NONREACTIVE

## 2017-01-14 LAB — HEPATITIS C ANTIBODY (REFLEX)

## 2017-01-14 LAB — HCV COMMENT:

## 2017-01-21 ENCOUNTER — Other Ambulatory Visit: Payer: Self-pay | Admitting: Student

## 2017-01-21 DIAGNOSIS — I10 Essential (primary) hypertension: Secondary | ICD-10-CM

## 2017-01-22 MED ORDER — HYDROCHLOROTHIAZIDE 25 MG PO TABS: 25.0000 mg | ORAL_TABLET | Freq: Every day | ORAL | 0 refills | Status: DC

## 2017-01-22 NOTE — Telephone Encounter (Signed)
Pt is calling to check the status of his refill request he sent via MY Chart. He needs a refill on his Hydrochlorothiazide called in/ jw

## 2017-01-29 ENCOUNTER — Ambulatory Visit (INDEPENDENT_AMBULATORY_CARE_PROVIDER_SITE_OTHER): Payer: 59 | Admitting: Student

## 2017-01-29 ENCOUNTER — Other Ambulatory Visit: Payer: Self-pay | Admitting: Family Medicine

## 2017-01-29 ENCOUNTER — Encounter: Payer: Self-pay | Admitting: Student

## 2017-01-29 VITALS — BP 130/80 | HR 80 | Temp 98.2°F | Ht 67.0 in | Wt 202.2 lb

## 2017-01-29 DIAGNOSIS — I10 Essential (primary) hypertension: Secondary | ICD-10-CM | POA: Diagnosis not present

## 2017-01-29 DIAGNOSIS — D582 Other hemoglobinopathies: Secondary | ICD-10-CM | POA: Diagnosis not present

## 2017-01-29 DIAGNOSIS — Z0000 Encounter for general adult medical examination without abnormal findings: Secondary | ICD-10-CM

## 2017-01-29 DIAGNOSIS — R0981 Nasal congestion: Secondary | ICD-10-CM

## 2017-01-29 DIAGNOSIS — E7800 Pure hypercholesterolemia, unspecified: Secondary | ICD-10-CM | POA: Diagnosis not present

## 2017-01-29 MED ORDER — IPRATROPIUM BROMIDE 0.06 % NA SOLN: 2.0000 | Freq: Four times a day (QID) | NASAL | 3 refills | Status: DC

## 2017-01-29 NOTE — Assessment & Plan Note (Addendum)
Not on statin. ASCVD risk score 4.5% about two years ago.   Lipid panel today.   Life style change as above

## 2017-01-29 NOTE — Progress Notes (Signed)
Subjective:    Jared Michael is a 51 y.o. old male here for follow up on hypertension.   HPI Hypertension: doesn't check his blood pressure at home. We increased his HCTZ to 25 mg at his last visit about a month ago. He reports good compliance with this. He is tolerating the medication well. He doesn't exercise. Reports occasional snoring but not to the extent to bother his wife. Denies daytime sleepiness. Denies headache, chest pain, shortness of breath, dizziness, palpitation, edema or focal neuro symptoms. Overall, he reports feeling well. His dizziness has resolved. He says he had discomfort in his ear that has resolved as well after increasing his HCTZ to 25 mg and he is happy.  Not sure why he had the discomfort in the first place.    Hyperlipidemia: last lipid panel about two years ago. He stopped taking his simvastatin almost two years ago. His ASCVD risk score was 4.5%. He has no diabetes.   PMH/Problem List: has Hyperlipidemia; Encounter for screening colonoscopy; Elevated hemoglobin ([place]); Penile pain; Essential hypertension; Nasal congestion; and Routine adult health maintenance on his problem list.  FH:  Denies family history of cancer or cardiac disease.   SH Reports smoking Cigar occasionally.   Review of Systems Review of systems negative except for pertinent positives and negatives in history of present illness above.     Objective:     Vitals:   01/29/17 0942 01/29/17 1028  BP: 138/90 130/80  Pulse: 80   Temp: 98.2 F (36.8 C)   TempSrc: Oral   SpO2: 98%   Weight: 202 lb 3.2 oz (91.7 kg)   Height: 5\' 7"  (1.702 m)   Body mass index is 31.67 kg/m.  Physical Exam GEN: appears well, no apparent distress. Eyes: conjunctiva without injection, sclera anicteric Ears: external ear, ear canal and TM normal Oropharynx: mmm without erythema or exudation CVS: RRR, nl S1&S2, no murmurs, no edema, radial pulses 2+ bilaterally, no carotid bruits RESP: speaks in full sentence,  no IWOB, good air movement bilaterally, CTAB GI: BS present & normal, soft, NTND MSK: no focal tenderness or notable swelling SKIN: no apparent skin lesion ENDO: negative thyromegally NEURO: alert and oiented appropriately, no gross defecits  PSYCH: euthymic mood with congruent affect    Assessment and Plan:  Essential hypertension Controlled. BP 130/80 today.  -Continue HCTZ 25 mg daily. -BMP & TSH today -Advised to be cautious with over the counter nasal decongestants and NSAID's -Discussed about life style changes including exercise and diet. He says he lives across from [place]. Gave handout -Recommend home BP and keeping diary -Follow up in 6 months or earlier as needed.    Hyperlipidemia Not on statin. ASCVD risk score 4.5% about two years ago.   Lipid panel today.   Life style change as above  Elevated hemoglobin Last Hgb 17.4 about two years ago. Reports occasional snoring at night but denies daytime sleepiness. Denies OTC supplement. No constitutional symptoms.  -CBC with diff today.  Nasal congestion Likely allergy. Not itchy or sneezing -Discussed about proper use of Flonase -Gave Rx for Ipratropium nasal spray for congestion -Discontinued Claritin-D.   Routine adult health maintenance Uptodate except colonoscopy. Discussed about the importance of this. Gave handout and phone numbers to schedule.     Orders Placed This Encounter  Procedures  . CBC with Differential    Standing Status:   Future    Standing Expiration Date:   01/29/2018  . Basic Metabolic Panel (BMET)    Standing Status:  Future    Standing Expiration Date:   01/29/2018  . TSH    Standing Status:   Future    Standing Expiration Date:   01/29/2018  . LDL cholesterol, direct    Standing Status:   Future    Standing Expiration Date:   01/29/2018    Return in about 6 months (around 08/01/2017) for HTN.  Mercy Riding, MD 01/29/17 Pager: [phone]

## 2017-01-29 NOTE — Assessment & Plan Note (Signed)
Uptodate except colonoscopy. Discussed about the importance of this. Gave handout and phone numbers to schedule.

## 2017-01-29 NOTE — Assessment & Plan Note (Addendum)
Controlled. BP 130/80 today.  -Continue HCTZ 25 mg daily. -BMP & TSH today -Advised to be cautious with over the counter nasal decongestants and NSAID's -Discussed about life style changes including exercise and diet. He says he lives across from [place]. Gave handout -Recommend home BP and keeping diary -Follow up in 6 months or earlier as needed.

## 2017-01-29 NOTE — Assessment & Plan Note (Addendum)
Last Hgb 17.4 about two years ago. Reports occasional snoring at night but denies daytime sleepiness. Denies OTC supplement. No constitutional symptoms.  -CBC with diff today.

## 2017-01-29 NOTE — Patient Instructions (Signed)
It was great seeing you today! We have addressed the following issues today 1. Blood pressure: Your blood pressure is 130/80. Your goal blood pressure is less than 140/90. Continue hydrochlorothiazide. I also recommend lifestyle changes including exercise and diet as below. Please avoid over the counter nasal decongestant such as pseudoephedrine and over-the-counter pain medications such as Advil or ibuprofen. I have sent a prescription for ipratropium nasal spray to your pharmacy for you nasal congestion.  2.   Cholesterol: We are checking your lipid. You can have your blood draw a at [facility] across the street.  3.   Colon cancer screening: it is very important that you have your colonoscopy done for colon cancer screening. See below for more information.    If we did any lab work today, and the results require attention, either me or my nurse will get in touch with you. If everything is normal, you will get a letter in mail and a message via . If you don't hear from [place] in two weeks, please give [place] a call. Otherwise, we look forward to seeing you again at your next visit. If you have any questions or concerns before then, please call the clinic at [phone].  Please bring all your medications to every doctors visit  Sign up for My Chart to have easy access to your labs results, and communication with your Primary care physician.    Please check-out at the front desk before leaving the clinic.    Take Care,   Dr. Cyndia Skeeters  Portion Size    Choose healthier foods such as 100% whole grains, vegetables, fruits, beans, nut seeds, olive oil, most vegetable oils, fat-free dietary, wild game and fish.   Avoid sweet tea, other sweetened beverages, soda, fruit juice, cold cereal and milk and trans fat.   Eat at least 3 meals and 1-2 snacks per day.  Aim for no more than 5 hours between eating.  Eat breakfast within one hour of getting up.    Exercise at least 150 minutes per week,  including weight resistance exercises 3 or 4 times per week.   Try to lose at least 7-10% of your current body weight.             Colon Cancer  People with early colon cancer usually have no warning signs or symptoms.  If found early, most patients can be cured, but if found when it has already spread, the chance of survival is not as good.  Colon cancer is the second most common cause of concern is in the [place] with over 56,000 deaths from colon cancer in 2005  Colon cancer is a common, treatable disease. Screening tests can find a cancer  early, before you have symptoms, and make it more likely that you will survive the disease.  Who needs to be tested? If you are age 41-75 yrs, you should be tested for colon cancer.  Ways to be tested:  A colonoscopy the best test to detect colon cancer. It requires you to drink a bowel preparation to clean out your colon before the test. During this test, a tube with a camera inserted into your rectum and examines your entire colon. You can be given medicine to make you sleepy during the exam. Therefore, you will not be able to drive immediately after the test. There is a small risk of bowel injury during the test.   Stool cards that you can take home and take a sample of your  stool is another option. The cards are not as good as colonoscopy at detecting cancer, but the tests are easier and cheaper.   To schedule the colonoscopy, you can call one of the 3 options below:  [facility]. Phone number: [phone]  [facility]. Phone number: [phone]  [facility]: Phone number [phone]

## 2017-01-29 NOTE — Assessment & Plan Note (Addendum)
Likely allergy. Not itchy or sneezing -Discussed about proper use of Flonase -Gave Rx for Ipratropium nasal spray for congestion -Discontinued Claritin-D.

## 2017-01-30 LAB — BASIC METABOLIC PANEL
BUN/Creatinine Ratio: 16 (ref 9–20)
BUN: 13 mg/dL (ref 6–24)
CALCIUM: 9.6 mg/dL (ref 8.7–10.2)
CHLORIDE: 99 mmol/L (ref 96–106)
CO2: 25 mmol/L (ref 18–29)
Creatinine, Ser: 0.83 mg/dL (ref 0.76–1.27)
GFR calc non Af Amer: 101 mL/min/{1.73_m2} (ref >59)
GFR, EST AFRICAN AMERICAN: 117 mL/min/{1.73_m2} (ref >59)
GLUCOSE: 109 mg/dL — AB (ref 65–99)
Potassium: 4 mmol/L (ref 3.5–5.2)
Sodium: 144 mmol/L (ref 134–144)

## 2017-01-30 LAB — CBC WITH DIFFERENTIAL/PLATELET
BASOS: 0 %
Basophils Absolute: 0 10*3/uL (ref 0.0–0.2)
EOS (ABSOLUTE): 0.1 10*3/uL (ref 0.0–0.4)
EOS: 1 %
Hematocrit: 50.1 % (ref 37.5–51.0)
Hemoglobin: 18.1 g/dL — ABNORMAL HIGH (ref 13.0–17.7)
IMMATURE GRANULOCYTES: 0 %
Immature Grans (Abs): 0 10*3/uL (ref 0.0–0.1)
Lymphocytes Absolute: 2.8 10*3/uL (ref 0.7–3.1)
Lymphs: 38 %
MCH: 32.1 pg (ref 26.6–33.0)
MCHC: 36.1 g/dL — ABNORMAL HIGH (ref 31.5–35.7)
MCV: 89 fL (ref 79–97)
MONOS ABS: 0.6 10*3/uL (ref 0.1–0.9)
Monocytes: 8 %
NEUTROS ABS: 3.8 10*3/uL (ref 1.4–7.0)
NEUTROS PCT: 53 %
Platelets: 230 10*3/uL (ref 150–379)
RBC: 5.63 x10E6/uL (ref 4.14–5.80)
RDW: 13.4 % (ref 12.3–15.4)
WBC: 7.3 10*3/uL (ref 3.4–10.8)

## 2017-01-30 LAB — LDL CHOLESTEROL, DIRECT: LDL Direct: 184 mg/dL — ABNORMAL HIGH (ref 0–99)

## 2017-01-30 LAB — TSH: TSH: 3.01 u[IU]/mL (ref 0.450–4.500)

## 2017-02-19 ENCOUNTER — Other Ambulatory Visit: Payer: Self-pay | Admitting: Student

## 2017-02-19 DIAGNOSIS — I10 Essential (primary) hypertension: Secondary | ICD-10-CM

## 2017-02-23 ENCOUNTER — Telehealth: Payer: Self-pay | Admitting: Student

## 2017-02-23 NOTE — Telephone Encounter (Signed)
Called patient to discuss about his lab results. Hgb elevated to 18. Not sure why.  Will send him my chart message.

## 2017-03-22 ENCOUNTER — Other Ambulatory Visit: Payer: Self-pay | Admitting: Student

## 2017-03-22 DIAGNOSIS — I10 Essential (primary) hypertension: Secondary | ICD-10-CM

## 2017-03-23 MED ORDER — HYDROCHLOROTHIAZIDE 25 MG PO TABS: 25.0000 mg | ORAL_TABLET | Freq: Every day | ORAL | 0 refills | Status: DC

## 2017-04-18 ENCOUNTER — Other Ambulatory Visit: Payer: Self-pay | Admitting: Student

## 2017-04-18 DIAGNOSIS — I10 Essential (primary) hypertension: Secondary | ICD-10-CM

## 2017-04-22 ENCOUNTER — Other Ambulatory Visit: Payer: Self-pay | Admitting: Student

## 2017-04-22 DIAGNOSIS — I10 Essential (primary) hypertension: Secondary | ICD-10-CM

## 2017-04-23 ENCOUNTER — Other Ambulatory Visit: Payer: Self-pay | Admitting: Student

## 2017-04-23 DIAGNOSIS — I10 Essential (primary) hypertension: Secondary | ICD-10-CM

## 2017-04-23 MED ORDER — HYDROCHLOROTHIAZIDE 25 MG PO TABS: 25.0000 mg | ORAL_TABLET | Freq: Every day | ORAL | 1 refills | Status: DC

## 2017-04-23 NOTE — Telephone Encounter (Signed)
Pt has been emailing the doctor through [facility] to request a refill on his BP medication, He took the last dose today and really needs this to be called in right away. He also asking for this to have refills on this so he doesn't have to call every month and wait to get his medication. jw

## 2017-04-23 NOTE — Telephone Encounter (Signed)
Done

## 2017-10-13 ENCOUNTER — Other Ambulatory Visit: Payer: Self-pay | Admitting: Student

## 2017-10-13 DIAGNOSIS — I10 Essential (primary) hypertension: Secondary | ICD-10-CM

## 2017-10-13 MED ORDER — HYDROCHLOROTHIAZIDE 25 MG PO TABS: 25.0000 mg | ORAL_TABLET | Freq: Every day | ORAL | 3 refills | Status: DC

## 2017-10-14 ENCOUNTER — Telehealth: Payer: Self-pay | Admitting: Student

## 2017-10-14 ENCOUNTER — Other Ambulatory Visit: Payer: Self-pay | Admitting: Student

## 2017-10-14 DIAGNOSIS — I10 Essential (primary) hypertension: Secondary | ICD-10-CM

## 2017-10-14 MED ORDER — HYDROCHLOROTHIAZIDE 25 MG PO TABS: 25.0000 mg | ORAL_TABLET | Freq: Every day | ORAL | 3 refills | Status: AC

## 2017-10-14 NOTE — Progress Notes (Signed)
HCTZ sent to [facility].

## 2017-10-14 NOTE — Telephone Encounter (Signed)
Pt needs refill on his hydrodiuril and it looks like it was filled yesterday but it was sent to the wrong pharmacy. He needs this refill sent to the [facility] pharmacy on [address], please. This pharmacy also needs to be updated in his chart. Pleas advise

## 2017-10-14 NOTE — Telephone Encounter (Signed)
HCTZ sent to [facility]

## 2017-10-14 NOTE — Telephone Encounter (Signed)
Called patient to inform him that the only pharmacy that is in his charts now  is [facility] and that I have deleted [facility] [address]. I apologized for his medicine being sent to the wrong pharmacy and told him that it was probably not updated because he has not been here since March of 2018. I [facility] and [facility] to [facility]. He stated that he would not be calling them at all because it was our fault. He says that he has not been here in 6 months and needs a 6 month supply of medicine because he is tired of the problems that he has every time that he comes to [facility]. I told him that I could make him an appointment to discuss getting 6 month prescriptions and he said that he would not be coming back here again. He says that he will be looking for a new doctor that won't give him hassles about his prescriptions. He would like for Dr. Cyndia Skeeters to send a 6 month supply of HCTZ to [facility] as that will give him time to find another doctor.Ozella Almond

## 2018-01-03 NOTE — Progress Notes (Signed)
Cancelling order as lab not collected, no need to re-order 

## 2018-04-02 ENCOUNTER — Emergency Department (HOSPITAL_COMMUNITY): Payer: 59

## 2018-04-02 ENCOUNTER — Encounter (HOSPITAL_COMMUNITY): Payer: Self-pay | Admitting: *Deleted

## 2018-04-02 ENCOUNTER — Emergency Department (HOSPITAL_COMMUNITY)
Admission: EM | Admit: 2018-04-02 | Discharge: 2018-04-02 | Disposition: A | Discharge status: Home / Self Care | Payer: 59 | Attending: Emergency Medicine | Admitting: Emergency Medicine

## 2018-04-02 ENCOUNTER — Other Ambulatory Visit: Payer: Self-pay

## 2018-04-02 DIAGNOSIS — I10 Essential (primary) hypertension: Secondary | ICD-10-CM | POA: Insufficient documentation

## 2018-04-02 DIAGNOSIS — E7800 Pure hypercholesterolemia, unspecified: Secondary | ICD-10-CM | POA: Diagnosis not present

## 2018-04-02 DIAGNOSIS — K5730 Diverticulosis of large intestine without perforation or abscess without bleeding: Secondary | ICD-10-CM | POA: Insufficient documentation

## 2018-04-02 DIAGNOSIS — Z79899 Other long term (current) drug therapy: Secondary | ICD-10-CM | POA: Diagnosis not present

## 2018-04-02 DIAGNOSIS — M545 Low back pain: Secondary | ICD-10-CM | POA: Diagnosis present

## 2018-04-02 DIAGNOSIS — N201 Calculus of ureter: Secondary | ICD-10-CM | POA: Diagnosis not present

## 2018-04-02 DIAGNOSIS — K5790 Diverticulosis of intestine, part unspecified, without perforation or abscess without bleeding: Secondary | ICD-10-CM | POA: Diagnosis present

## 2018-04-02 HISTORY — DX: Disorder of kidney and ureter, unspecified: N28.9

## 2018-04-02 LAB — URINALYSIS, ROUTINE W REFLEX MICROSCOPIC
BILIRUBIN URINE: NEGATIVE
Bacteria, UA: NONE SEEN
Glucose, UA: NEGATIVE mg/dL
Ketones, ur: NEGATIVE mg/dL
Leukocytes, UA: NEGATIVE
Nitrite: NEGATIVE
PROTEIN: NEGATIVE mg/dL
SPECIFIC GRAVITY, URINE: 1.017 (ref 1.005–1.030)
pH: 9 — ABNORMAL HIGH (ref 5.0–8.0)

## 2018-04-02 LAB — CBC
HCT: 49.3 % (ref 39.0–52.0)
Hemoglobin: 16.7 g/dL (ref 13.0–17.0)
MCH: 30.6 pg (ref 26.0–34.0)
MCHC: 33.9 g/dL (ref 30.0–36.0)
MCV: 90.5 fL (ref 78.0–100.0)
Platelets: 274 10*3/uL (ref 150–400)
RBC: 5.45 MIL/uL (ref 4.22–5.81)
RDW: 12.3 % (ref 11.5–15.5)
WBC: 13.1 10*3/uL — ABNORMAL HIGH (ref 4.0–10.5)

## 2018-04-02 LAB — BASIC METABOLIC PANEL
Anion gap: 10 (ref 5–15)
BUN: 12 mg/dL (ref 6–20)
CALCIUM: 9.4 mg/dL (ref 8.9–10.3)
CO2: 28 mmol/L (ref 22–32)
CREATININE: 1.1 mg/dL (ref 0.61–1.24)
Chloride: 100 mmol/L — ABNORMAL LOW (ref 101–111)
GFR calc Af Amer: >60 mL/min (ref >60)
GFR calc non Af Amer: >60 mL/min (ref >60)
GLUCOSE: 149 mg/dL — AB (ref 65–99)
Potassium: 3.4 mmol/L — ABNORMAL LOW (ref 3.5–5.1)
Sodium: 138 mmol/L (ref 135–145)

## 2018-04-02 MED ORDER — OXYCODONE-ACETAMINOPHEN 5-325 MG PO TABS
1.0000 | ORAL_TABLET | ORAL | Status: DC | PRN
  Administered 2018-04-02: 1 via ORAL
  Filled 2018-04-02: qty 1

## 2018-04-02 MED ORDER — HYDROCODONE-ACETAMINOPHEN 5-325 MG PO TABS: 1.0000 | ORAL_TABLET | ORAL | 0 refills | Status: DC | PRN

## 2018-04-02 MED ORDER — OXYCODONE-ACETAMINOPHEN 5-325 MG PO TABS
1.0000 | ORAL_TABLET | Freq: Once | ORAL | Status: AC
  Administered 2018-04-02: 1 via ORAL
  Filled 2018-04-02: qty 1

## 2018-04-02 MED ORDER — ONDANSETRON 4 MG PO TBDP: 4.0000 mg | ORAL_TABLET | Freq: Three times a day (TID) | ORAL | 0 refills | Status: AC | PRN

## 2018-04-02 MED ORDER — TAMSULOSIN HCL 0.4 MG PO CAPS: 0.4000 mg | ORAL_CAPSULE | Freq: Every day | ORAL | 0 refills | Status: DC

## 2018-04-02 NOTE — ED Provider Notes (Signed)
[facility] EMERGENCY DEPARTMENT Provider Note   CSN: [id number] Arrival date & time: 04/02/18  6644     History   Chief Complaint Chief Complaint  Patient presents with  . Back Pain    HPI Deveon Kisiel is a 52 y.o. male with a remote past medical history of kidney stones, last stone approximately 20 years ago, who presents today for evaluation of right lower back/side pain.  He reports that his pain increases when he attempts to urinate.  He reports that yesterday he had slight urinary pain, however when he woke up this morning he had right-sided flank pain.  He reports that he is unsure if this feels like his previous kidney stone given that he has not had one in many years.  He denies nausea or vomiting.  He reports that his pain started this morning when he attempted to use the bathroom.  He defecated without difficulty however had pain with urination.  HPI  Past Medical History:  Diagnosis Date  . Allergy   . High cholesterol   . Renal disorder     Patient Active Problem List   Diagnosis Date Noted  . Diverticulosis 04/02/2018  . Nasal congestion 01/29/2017  . Routine adult health maintenance 01/29/2017  . Essential hypertension 12/17/2016  . Penile pain 06/05/2015  . Elevated hemoglobin ([place]) 07/18/2014  . Hyperlipidemia 06/20/2014  . Encounter for screening colonoscopy 06/20/2014    Past Surgical History:  Procedure Laterality Date  . HERNIA REPAIR    . TONSILLECTOMY          Home Medications    Prior to Admission medications   Medication Sig Start Date End Date Taking? Authorizing Provider  hydrochlorothiazide (HYDRODIURIL) 25 MG tablet Take 1 tablet (25 mg total) by mouth daily. 10/14/17  Yes Mercy Riding, MD  ipratropium (ATROVENT) 0.06 % nasal spray Place 2 sprays into both nostrils 4 (four) times daily. For runny nose, sneezing and congestion 01/29/17  Yes Gonfa, Taye T, MD  lisinopril (PRINIVIL,ZESTRIL) 2.5 MG tablet Take 2.5 mg by  mouth daily. 01/11/18  Yes [provider]  naproxen sodium (ALEVE) 220 MG tablet Take 1 tablet (220 mg total) by mouth 2 (two) times daily with a meal. 06/20/14  Yes Hess, Bryan R, DO  HYDROcodone-acetaminophen (NORCO/VICODIN) 5-325 MG tablet Take 1 tablet by mouth every 4 (four) hours as needed. 04/02/18   Lorin Glass, PA-C  ondansetron (ZOFRAN ODT) 4 MG disintegrating tablet Take 1-2 tablets (4-8 mg total) by mouth every 8 (eight) hours as needed for nausea or vomiting. 04/02/18   Lorin Glass, PA-C  OVER THE COUNTER MEDICATION Take 2 packets by mouth daily. Core Mega Packets (Omega 3 supplement)    [provider]  tamsulosin (FLOMAX) 0.4 MG CAPS capsule Take 1 capsule (0.4 mg total) by mouth daily. 04/02/18   Lorin Glass, PA-C    Family History No family history on file.  Social History Social History   Tobacco Use  . Smoking status: Never Smoker  . Smokeless tobacco: Never Used  Substance Use Topics  . Alcohol use: Yes    Alcohol/week: 2.4 oz    Types: 4 Cans of beer per week  . Drug use: No     Allergies   Patient has no known allergies.   Review of Systems Review of Systems  Constitutional: Negative for chills and fever.  Respiratory: Negative for shortness of breath.   Cardiovascular: Negative for chest pain.  Gastrointestinal: Negative for  abdominal distention, abdominal pain, blood in stool, constipation, diarrhea, nausea and vomiting.  Genitourinary: Positive for difficulty urinating, dysuria, flank pain and hematuria. Negative for decreased urine volume, frequency, testicular pain and urgency.  Musculoskeletal: Positive for back pain.  Skin: Negative for color change, rash and wound.  Neurological: Negative for weakness and numbness.  All other systems reviewed and are negative.    Physical Exam Updated Vital Signs BP (!) 158/96 (BP Location: Right Arm)   Pulse 82   Temp 98.2 F (36.8 C) (Oral)   Resp 17   Ht 5\' 7"   (1.702 m)   Wt 90.7 kg (200 lb)   SpO2 98%   BMI 31.32 kg/m   Physical Exam  Constitutional: He is oriented to person, place, and time. He appears well-developed and well-nourished.  HENT:  Head: Normocephalic and atraumatic.  Eyes: Conjunctivae are normal.  Neck: Normal range of motion. Neck supple.  Cardiovascular: Normal rate, regular rhythm, normal heart sounds and intact distal pulses.  No murmur heard. 2+ DP/PT pulses bilaterally.  Bilateral upper and lower extremities are warm and well perfused.  Pulmonary/Chest: Effort normal and breath sounds normal. No respiratory distress.  Abdominal: Soft. Bowel sounds are normal. He exhibits no distension and no mass. There is no tenderness. There is no rigidity, no rebound and no guarding.  There is mild tenderness to percussion and right lower/lumbar back, lower than true CVA tenderness.  Musculoskeletal: He exhibits no edema.  Neurological: He is alert and oriented to person, place, and time.  Skin: Skin is warm and dry. He is not diaphoretic.  Psychiatric: He has a normal mood and affect.  Nursing note and vitals reviewed.    ED Treatments / Results  Labs (all labs ordered are listed, but only abnormal results are displayed) Labs Reviewed  BASIC METABOLIC PANEL - Abnormal; Notable for the following components:      Result Value   Potassium 3.4 (*)    Chloride 100 (*)    Glucose, Bld 149 (*)    All other components within normal limits  CBC - Abnormal; Notable for the following components:   WBC 13.1 (*)    All other components within normal limits  URINALYSIS, ROUTINE W REFLEX MICROSCOPIC - Abnormal; Notable for the following components:   pH 9.0 (*)    Hgb urine dipstick LARGE (*)    RBC / HPF >50 (*)    All other components within normal limits    EKG None  Radiology Ct Renal Stone Study  Result Date: 04/02/2018 CLINICAL DATA:  52 year old male with acute RIGHT flank and back pain with decreased urine output.  EXAM: CT ABDOMEN AND PELVIS WITHOUT CONTRAST TECHNIQUE: Multidetector CT imaging of the abdomen and pelvis was performed following the standard protocol without IV contrast. COMPARISON:  01/01/2016 FINDINGS: Lower chest: No acute abnormality Hepatobiliary: The liver and gallbladder are unremarkable except for possible mild hepatic steatosis. Pancreas: Unremarkable Spleen: Unremarkable Adrenals/Urinary Tract: A 2.5 mm RIGHT UVJ calculus causes mild RIGHT hydroureteronephrosis. No other renal abnormalities are identified. The adrenal glands are unremarkable. Stomach/Bowel: Stomach is within normal limits. Appendix appears normal. No evidence of bowel wall thickening, distention, or inflammatory changes. Colonic diverticulosis noted without evidence of diverticulitis. Vascular/Lymphatic: No significant vascular findings are present. No enlarged abdominal or pelvic lymph nodes. Reproductive: Mild prostate enlargement again noted Other: No ascites, focal collection or pneumoperitoneum. Musculoskeletal: No acute or suspicious bony abnormality. IMPRESSION: 1. 2.5 mm RIGHT UVJ calculus causing mild RIGHT hydroureteronephrosis. 2. Colonic diverticulosis  without diverticulitis. 3. Question mild hepatic steatosis. Electronically Signed   By: Margarette Canada M.D.   On: 04/02/2018 19:18    Procedures Procedures (including critical care time)  Medications Ordered in ED Medications  oxyCODONE-acetaminophen (PERCOCET/ROXICET) 5-325 MG per tablet 1 tablet (1 tablet Oral Given 04/02/18 1816)     Initial Impression / Assessment and Plan / ED Course  I have reviewed the triage vital signs and the nursing notes.  Pertinent labs & imaging results that were available during my care of the patient were reviewed by me and considered in my medical decision making (see chart for details).     Patient presents today for evaluation of right-sided flank pain.  He has a very remote history of kidney stones, last time was  approximately 20 years ago.  Labs were obtained and reviewed, mildly elevated white blood cell count 13.1, however patient is without other significant electrolyte or hematologic abnormalities.  UA with large blood, over 50 RBCs/hpf with no bacteria, protein, or nitrite.  Given that it has been many years since patient's previous kidney stone CT renal stone study was performed which showed a right UVJ calculus causing mild right hydroureteronephrosis.  CT also showed colonic diverticulosis and questionable hepatic steatosis, both of which patient was informed of.  Patient's pain and other symptoms were manageable in the emergency room.  He will be discharged home with a short course of hydrocodone, Zofran, and Flomax.  He was given urology follow-up.  Return precautions were discussed and patient states his understanding.  Discharged home.  Consistent with the STOP act, the [place] PMP was queried for the patient based on the information and address listed in the medical record for the past year, prior to the prescription of home narcotic medications.  Final Clinical Impressions(s) / ED Diagnoses   Final diagnoses:  Right ureteral stone  Diverticulosis    ED Discharge Orders        Ordered    HYDROcodone-acetaminophen (NORCO/VICODIN) 5-325 MG tablet  Every 4 hours PRN     04/02/18 2034    ondansetron (ZOFRAN ODT) 4 MG disintegrating tablet  Every 8 hours PRN     04/02/18 2034    tamsulosin (FLOMAX) 0.4 MG CAPS capsule  Daily     04/02/18 2034       Lorin Glass, PA-C 04/03/18 0125    Fredia Sorrow, MD 04/07/18 2004

## 2018-04-02 NOTE — Discharge Instructions (Signed)
Please take Ibuprofen (Advil, motrin) and Tylenol (acetaminophen) to relieve your pain.  You may take up to 600 MG (3 pills) of normal strength ibuprofen every 8 hours as needed.  In between doses of ibuprofen you make take tylenol, up to 1,000 mg (two extra strength pills).  Do not take more than 3,000 mg tylenol in a 24 hour period.  Please check all medication labels as many medications such as pain and cold medications may contain tylenol.  Do not drink alcohol while taking these medications.  Do not take other NSAID'S while taking ibuprofen (such as aleve or naproxen).  Please take ibuprofen with food to decrease stomach upset.  Today you received medications that may make you sleepy or impair your ability to make decisions.  For the next 24 hours please do not drive, operate heavy machinery, care for a small child with out another adult present, or perform any activities that may cause harm to you or someone else if you were to fall asleep or be impaired.   You are being prescribed a medication which may make you sleepy. Please follow up of listed precautions for at least 24 hours after taking one dose.   If you develop fevers, nausea unable to be controlled with your nausea medicine, patient generally not feeling well, uncontrollable pain, or any other concerns please go to [facility] long emergency room for repeat evaluation.  We will in the emergency room your blood pressure was slightly elevated.  Please follow-up with your primary care provider for a blood pressure recheck in the next 1 to 2 weeks.

## 2018-04-02 NOTE — ED Notes (Signed)
Results reviewed.  No changes in acuity at this time 

## 2018-04-02 NOTE — ED Triage Notes (Signed)
To ED for eval of right lower back/side pain. Pain increases after patient attempts to urinate. 'only dripples come when I urinate'. Denies vomiting. Denies n/v.

## 2018-05-05 ENCOUNTER — Ambulatory Visit (HOSPITAL_COMMUNITY)
Admission: EM | Admit: 2018-05-05 | Discharge: 2018-05-05 | Disposition: A | Discharge status: Home / Self Care | Payer: 59 | Attending: Family Medicine | Admitting: Family Medicine

## 2018-05-05 ENCOUNTER — Ambulatory Visit (INDEPENDENT_AMBULATORY_CARE_PROVIDER_SITE_OTHER): Payer: 59

## 2018-05-05 ENCOUNTER — Encounter (HOSPITAL_COMMUNITY): Payer: Self-pay | Admitting: Emergency Medicine

## 2018-05-05 DIAGNOSIS — M25511 Pain in right shoulder: Secondary | ICD-10-CM

## 2018-05-05 MED ORDER — DICLOFENAC SODIUM 75 MG PO TBEC: 75.0000 mg | DELAYED_RELEASE_TABLET | Freq: Two times a day (BID) | ORAL | 0 refills | Status: AC

## 2018-05-05 NOTE — ED Provider Notes (Signed)
[place]    CSN: [id number] Arrival date & time: 05/05/18  1406     History   Chief Complaint Chief Complaint  Patient presents with  . Shoulder Pain    HPI Jared Michael is a 52 y.o. male presenting today for evaluation of right shoulder injury.  Patient states that he "tweaked" his right shoulder 2 days ago.  States that he was reaching into the trunk of his car for something felt a tweak.  Since he has had significant pain in his shoulder as well as limited range of motion.  States that he has pain when elevating his arm.  He denies previous issues with his shoulder.  Denies numbness or tingling.  Pain does radiate into his bicep.  Denies history of diabetes.  HPI  Past Medical History:  Diagnosis Date  . Allergy   . High cholesterol   . Renal disorder     Patient Active Problem List   Diagnosis Date Noted  . Diverticulosis 04/02/2018  . Nasal congestion 01/29/2017  . Routine adult health maintenance 01/29/2017  . Essential hypertension 12/17/2016  . Penile pain 06/05/2015  . Elevated hemoglobin ([place]) 07/18/2014  . Hyperlipidemia 06/20/2014  . Encounter for screening colonoscopy 06/20/2014    Past Surgical History:  Procedure Laterality Date  . HERNIA REPAIR    . TONSILLECTOMY         Home Medications    Prior to Admission medications   Medication Sig Start Date End Date Taking? Authorizing Provider  diclofenac (VOLTAREN) 75 MG EC tablet Take 1 tablet (75 mg total) by mouth 2 (two) times daily for 15 days. 05/05/18 05/20/18  Elfida Shimada C, PA-C  hydrochlorothiazide (HYDRODIURIL) 25 MG tablet Take 1 tablet (25 mg total) by mouth daily. 10/14/17   Mercy Riding, MD  HYDROcodone-acetaminophen (NORCO/VICODIN) 5-325 MG tablet Take 1 tablet by mouth every 4 (four) hours as needed. 04/02/18   Lorin Glass, PA-C  ipratropium (ATROVENT) 0.06 % nasal spray Place 2 sprays into both nostrils 4 (four) times daily. For runny nose, sneezing and  congestion 01/29/17   Wendee Beavers T, MD  lisinopril (PRINIVIL,ZESTRIL) 2.5 MG tablet Take 2.5 mg by mouth daily. 01/11/18   [provider]  naproxen sodium (ALEVE) 220 MG tablet Take 1 tablet (220 mg total) by mouth 2 (two) times daily with a meal. 06/20/14   Hess, Gaspar Bidding R, DO  ondansetron (ZOFRAN ODT) 4 MG disintegrating tablet Take 1-2 tablets (4-8 mg total) by mouth every 8 (eight) hours as needed for nausea or vomiting. 04/02/18   Lorin Glass, PA-C  OVER THE COUNTER MEDICATION Take 2 packets by mouth daily. Core Mega Packets (Omega 3 supplement)    [provider]  tamsulosin (FLOMAX) 0.4 MG CAPS capsule Take 1 capsule (0.4 mg total) by mouth daily. 04/02/18   Lorin Glass, PA-C    Family History No family history on file.  Social History Social History   Tobacco Use  . Smoking status: Never Smoker  . Smokeless tobacco: Never Used  Substance Use Topics  . Alcohol use: Yes    Alcohol/week: 2.4 oz    Types: 4 Cans of beer per week  . Drug use: No     Allergies   Patient has no known allergies.   Review of Systems Review of Systems  Constitutional: Negative for activity change, appetite change and fever.  Eyes: Negative for visual disturbance.  Respiratory: Negative for shortness of breath.   Cardiovascular: Negative for  chest pain.  Gastrointestinal: Negative for abdominal pain, nausea and vomiting.  Musculoskeletal: Positive for arthralgias and myalgias. Negative for gait problem, neck pain and neck stiffness.  Skin: Negative for wound.  Neurological: Negative for dizziness, syncope, speech difficulty, weakness, light-headedness, numbness and headaches.     Physical Exam Triage Vital Signs ED Triage Vitals [05/05/18 1413]  Enc Vitals Group     BP (!) 142/92     Pulse Rate 78     Resp 16     Temp 98.7 F (37.1 C)     Temp src      SpO2 100 %     Weight      Height      Head Circumference      Peak Flow      Pain Score      Pain  Loc      Pain Edu?      Excl. in [place]?    No data found.  Updated Vital Signs BP (!) 142/92   Pulse 78   Temp 98.7 F (37.1 C)   Resp 16   SpO2 100%   Visual Acuity Right Eye Distance:   Left Eye Distance:   Bilateral Distance:    Right Eye Near:   Left Eye Near:    Bilateral Near:     Physical Exam  Constitutional: He appears well-developed and well-nourished.  HENT:  Head: Normocephalic and atraumatic.  Eyes: Conjunctivae are normal.  Neck: Neck supple.  Cardiovascular: Normal rate and regular rhythm.  No murmur heard. Pulmonary/Chest: Effort normal and breath sounds normal. No respiratory distress.  Abdominal: Soft. There is no tenderness.  Musculoskeletal: He exhibits no edema.  Nontender to palpation over entire clavicle, nontender to palpation over [facility] joint, mild tenderness to palpation over supraspinatus muscle, nontender over infraspinatus or spinous process.  Patient has limited active range of motion with forward flexion of the arm, does not actively go beyond approximately 30 to 40 degrees.  Right arm passively ranged through entire R OM.  Has full active range of motion with lateral abduction as well as posterior extension.  Negative liftoff, negative empty can.  Pain with Yergason's.  Patient unable to perform Hawkins positioning.  Neurological: He is alert.  Skin: Skin is warm and dry.  Psychiatric: He has a normal mood and affect.  Nursing note and vitals reviewed.    UC Treatments / Results  Labs (all labs ordered are listed, but only abnormal results are displayed) Labs Reviewed - No data to display  EKG None  Radiology Dg Shoulder Right  Result Date: 05/05/2018 CLINICAL DATA:  Injury of the right shoulder on Tuesday with pain. EXAM: RIGHT SHOULDER - 2+ VIEW COMPARISON:  None. FINDINGS: There is no evidence of fracture or dislocation. There is no evidence of arthropathy or other focal bone abnormality. Soft tissues are unremarkable. IMPRESSION: No  acute fracture or dislocation. Electronically Signed   By: Abelardo Diesel M.D.   On: 05/05/2018 15:02    Procedures Procedures (including critical care time)  Medications Ordered in UC Medications - No data to display  Initial Impression / Assessment and Plan / UC Course  I have reviewed the triage vital signs and the nursing notes.  Pertinent labs & imaging results that were available during my care of the patient were reviewed by me and considered in my medical decision making (see chart for details).     X-ray negative.  Will have patient take anti-inflammatories, ice work on shoulder range  of motion exercises.  Follow-up with PCP or orthopedics if weakness persisting for evaluation of rotator cuff tendinopathy.  May require further imaging or physical therapy.Discussed strict return precautions. Patient verbalized understanding and is agreeable with plan.  Final Clinical Impressions(s) / UC Diagnoses   Final diagnoses:  Acute pain of right shoulder     Discharge Instructions     Use anti-inflammatories for pain/swelling. You may take up to 800 mg Ibuprofen every 8 hours OR Diclofenac twice daily with food. You may supplement Ibuprofen with Tylenol [phone] mg every 8 hours.   Apply ice  Work on shoulder range of motion exercises  Follow up with orthopedics if symptoms persisting     ED Prescriptions    Medication Sig Dispense Auth. Provider   diclofenac (VOLTAREN) 75 MG EC tablet Take 1 tablet (75 mg total) by mouth 2 (two) times daily for 15 days. 30 tablet Dorthey Depace, Gays C, PA-C     Controlled Substance Prescriptions [place] Controlled Substance Registry consulted? Not Applicable   Janith Lima, [place] 05/05/18 [phone]

## 2018-05-05 NOTE — Discharge Instructions (Signed)
Use anti-inflammatories for pain/swelling. You may take up to 800 mg Ibuprofen every 8 hours OR Diclofenac twice daily with food. You may supplement Ibuprofen with Tylenol [phone] mg every 8 hours.   Apply ice  Work on shoulder range of motion exercises  Follow up with orthopedics if symptoms persisting

## 2018-05-05 NOTE — ED Triage Notes (Signed)
Pt states "I tweaked my right shoulder, I reached in the back of my car for something and I felt a tweak in my shoulder, it happened on Tuesday."

## 2018-10-18 ENCOUNTER — Other Ambulatory Visit: Payer: Self-pay | Admitting: Internal Medicine

## 2018-10-18 ENCOUNTER — Ambulatory Visit
Admission: RE | Admit: 2018-10-18 | Discharge: 2018-10-18 | Disposition: A | Discharge status: Home / Self Care | Payer: 59 | Source: Ambulatory Visit | Attending: Internal Medicine | Admitting: Internal Medicine

## 2018-10-18 DIAGNOSIS — R059 Cough, unspecified: Secondary | ICD-10-CM

## 2018-10-18 DIAGNOSIS — R05 Cough: Secondary | ICD-10-CM

## 2019-10-17 ENCOUNTER — Other Ambulatory Visit: Payer: Self-pay | Admitting: Internal Medicine

## 2019-10-17 DIAGNOSIS — R1012 Left upper quadrant pain: Secondary | ICD-10-CM

## 2019-10-24 ENCOUNTER — Other Ambulatory Visit: Payer: Self-pay | Admitting: Internal Medicine

## 2019-10-25 ENCOUNTER — Ambulatory Visit
Admission: RE | Admit: 2019-10-25 | Discharge: 2019-10-25 | Disposition: A | Discharge status: Home / Self Care | Payer: 59 | Source: Ambulatory Visit | Attending: Internal Medicine | Admitting: Internal Medicine

## 2019-10-25 DIAGNOSIS — R1012 Left upper quadrant pain: Secondary | ICD-10-CM

## 2019-10-25 MED ORDER — IOPAMIDOL (ISOVUE-300) INJECTION 61%
100.0000 mL | Freq: Once | INTRAVENOUS | Status: AC | PRN
Start: 2019-10-25 — End: 2019-10-25
  Administered 2019-10-25: 100 mL via INTRAVENOUS

## 2020-03-14 ENCOUNTER — Ambulatory Visit: Payer: 59 | Attending: Internal Medicine

## 2020-03-14 DIAGNOSIS — Z23 Encounter for immunization: Secondary | ICD-10-CM

## 2020-03-14 NOTE — Progress Notes (Signed)
   Covid-19 Vaccination Clinic  Name:  Keevan Kimberling    MRN: [id number] DOB: [birth year]  03/14/2020  Mr. Paolucci was observed post Covid-19 immunization for 15 minutes without incident. He was provided with Vaccine Information Sheet and instruction to access the V-Safe system.   Mr. Zwiers was instructed to call 911 with any severe reactions post vaccine: Marland Kitchen Difficulty breathing  . Swelling of face and throat  . A fast heartbeat  . A bad rash all over body  . Dizziness and weakness   Immunizations Administered    Name Date Dose VIS Date Route   Pfizer COVID-19 Vaccine 03/14/2020 12:21 PM 0.3 mL 01/10/2019 Intramuscular   Manufacturer: [place]   Lot: [id number]   [place]: [id number]

## 2020-04-08 ENCOUNTER — Ambulatory Visit: Payer: 59 | Attending: Internal Medicine

## 2020-04-08 DIAGNOSIS — Z23 Encounter for immunization: Secondary | ICD-10-CM

## 2020-04-08 NOTE — Progress Notes (Signed)
   Covid-19 Vaccination Clinic  Name:  Jared Michael    MRN: [id number] DOB: 23-Sep-1966  04/08/2020  Jared Michael was observed post Covid-19 immunization for 15 minutes without incident. He was provided with Vaccine Information Sheet and instruction to access the V-Safe system.   Jared Michael was instructed to call 911 with any severe reactions post vaccine: Marland Kitchen Difficulty breathing  . Swelling of face and throat  . A fast heartbeat  . A bad rash all over body  . Dizziness and weakness   Immunizations Administered    Name Date Dose VIS Date Route   Pfizer COVID-19 Vaccine 04/08/2020 12:16 PM 0.3 mL 01/10/2019 Intramuscular   Manufacturer: [place]   Lot: [id number]   [place]: [id number]

## 2021-09-03 ENCOUNTER — Other Ambulatory Visit: Payer: Self-pay | Admitting: Internal Medicine

## 2021-09-03 DIAGNOSIS — R1013 Epigastric pain: Secondary | ICD-10-CM

## 2021-09-08 ENCOUNTER — Ambulatory Visit
Admission: RE | Admit: 2021-09-08 | Discharge: 2021-09-08 | Disposition: A | Discharge status: Home / Self Care | Payer: 59 | Source: Ambulatory Visit | Attending: Internal Medicine | Admitting: Internal Medicine

## 2021-09-08 DIAGNOSIS — R1013 Epigastric pain: Secondary | ICD-10-CM

## 2023-02-19 IMAGING — RF DG UGI W/ HIGH DENSITY W/O KUB
9 series · 14 of 24 positions shown · non-contrast
Comparison: CT abdomen 10/25/2019

CLINICAL DATA: Epigastric pain

EXAM:
UPPER GI SERIES WITH HIGH DENSITY WITHOUT KUB
TECHNIQUE: Scout radiograph was obtained. Combined double and single contrast
examination was performed using effervescent crystals, high-density
barium and thin liquid barium. This exam was performed by Loick
Athus, and was supervised and interpreted by Teophile Arnau.
FLUOROSCOPY TIME:  Radiation Exposure Index (as provided by the
fluoroscopic device): 26 mGy
If the device does not provide the exposure index:
Fluoroscopy Time:  1 minute 54 seconds
Number of Acquired Images:  0

[Series 1: one shot · 0.14mm/px · 1 of 1 slices shown (1 of 3)]
[im 1/1]
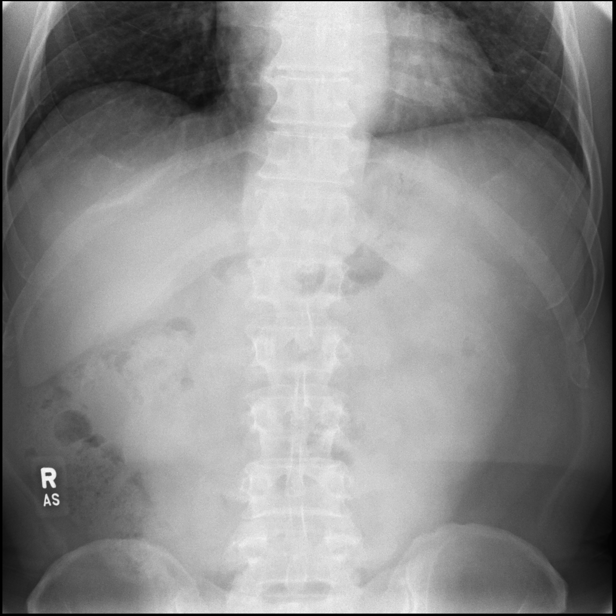

[Series 2: sequence · 1 of 41 frames shown (1 of 6)]
[frame 21/41]
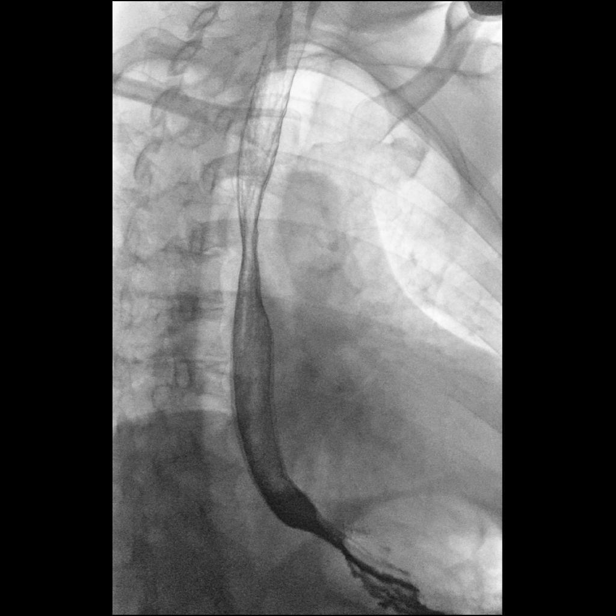

[Series 3: one shot · 3 of 6 slices shown (2 of 3)]
[im 2/6]
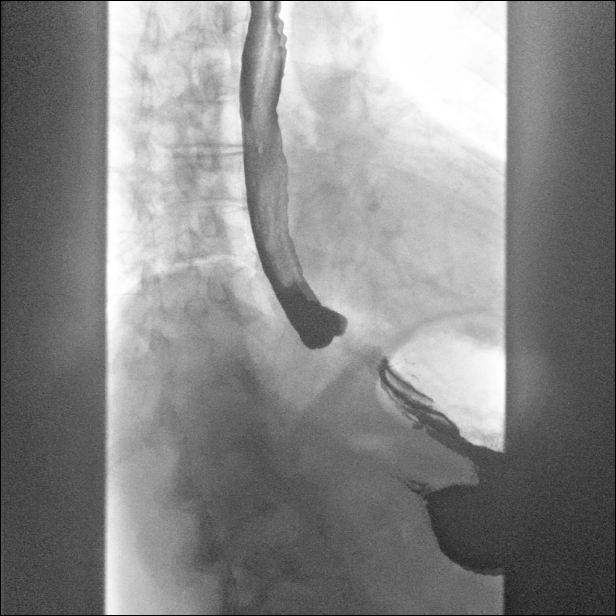
[im 4/6]
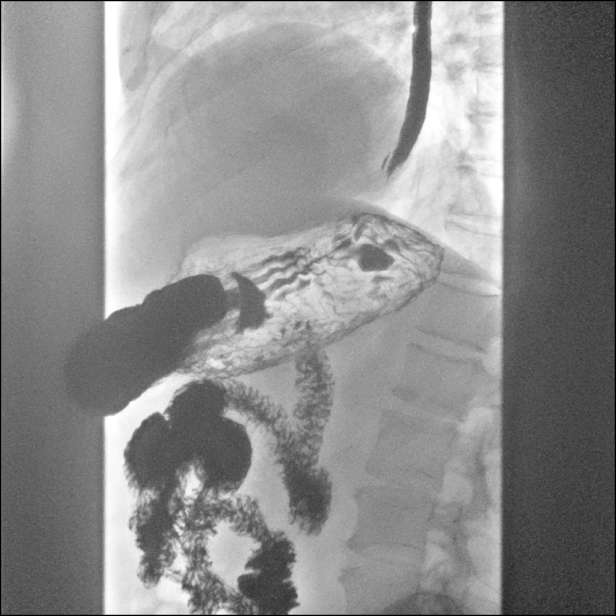
[im 6/6]
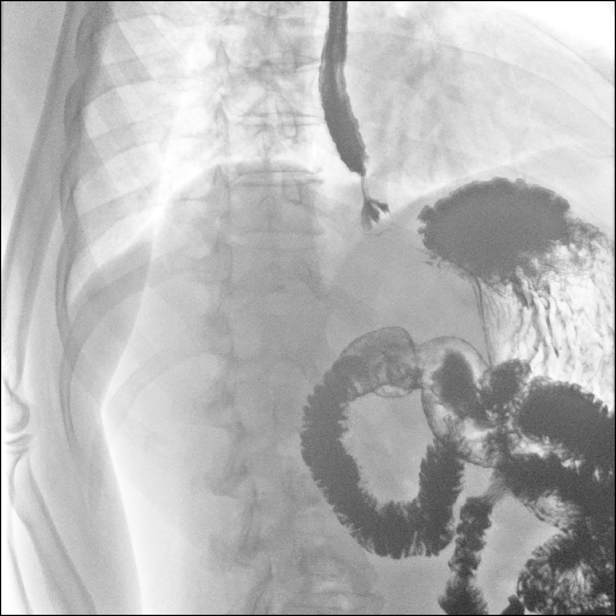

[Series 4: sequence · 1 of 40 frames shown (2 of 6)]
[frame 30/40]
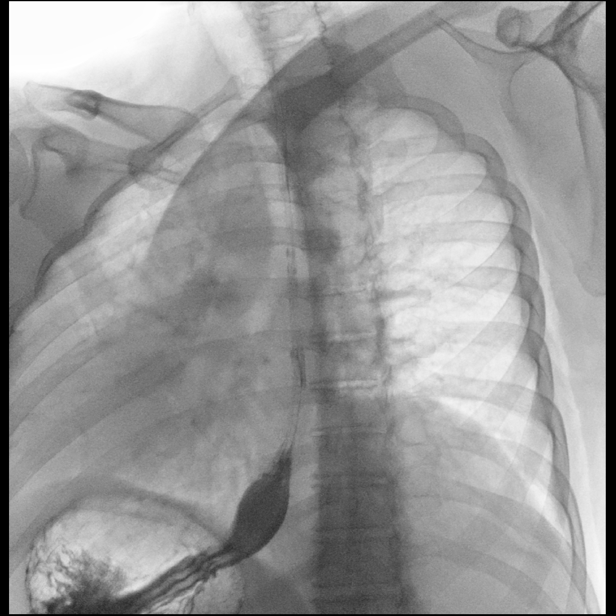

[Series 5: sequence · 2 of 45 frames shown (3 of 6)]
[frame 7/45]
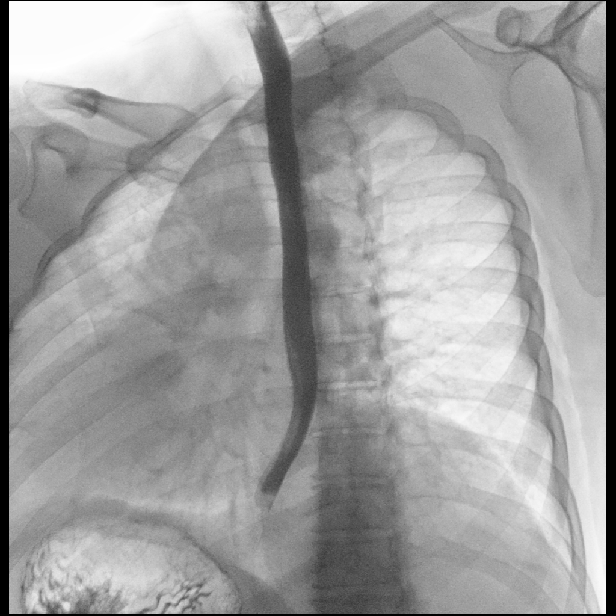
[frame 28/45]
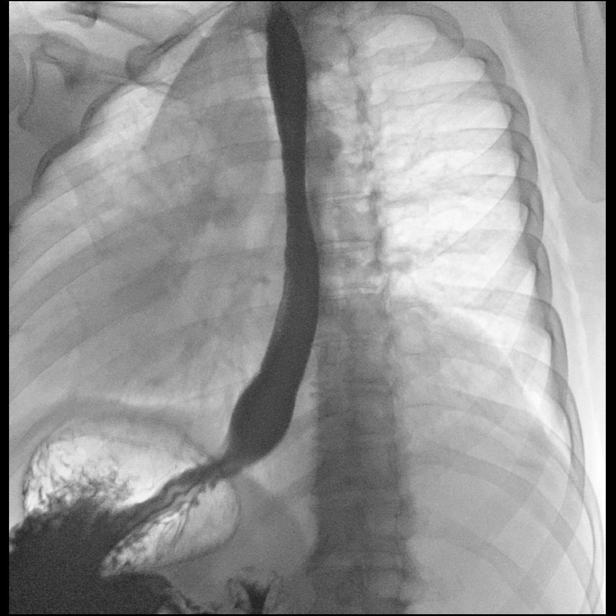

[Series 6: one shot · 1 of 2 slices shown (3 of 3)]
[im 1/2]
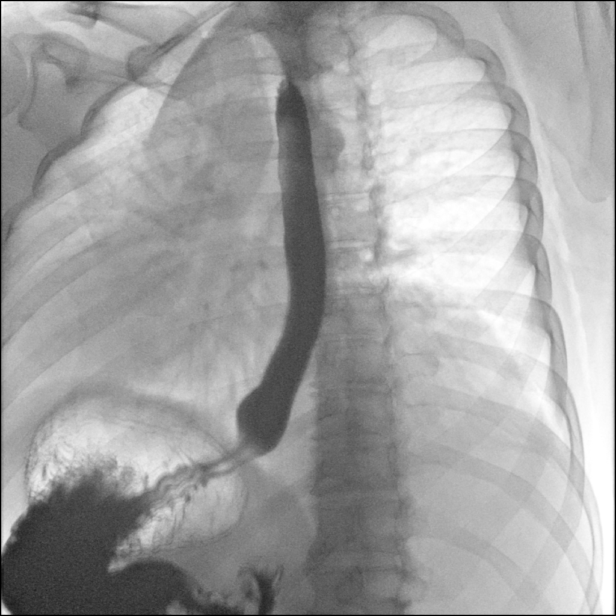

[Series 7: sequence · 1 of 7 frames shown (4 of 6)]
[frame 3/7]
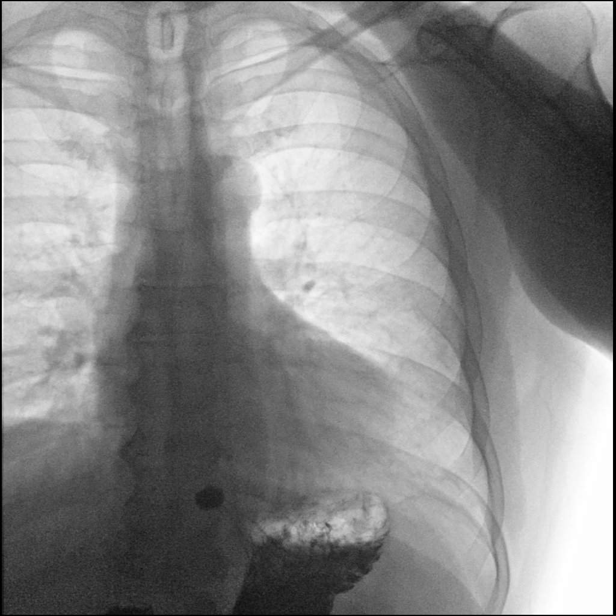

[Series 8: sequence · 2 of 29 frames shown (5 of 6)]
[frame 4/29]
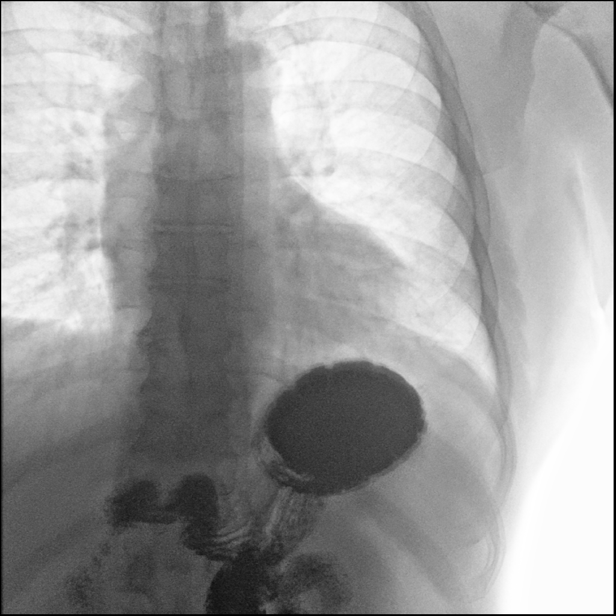
[frame 5/29]
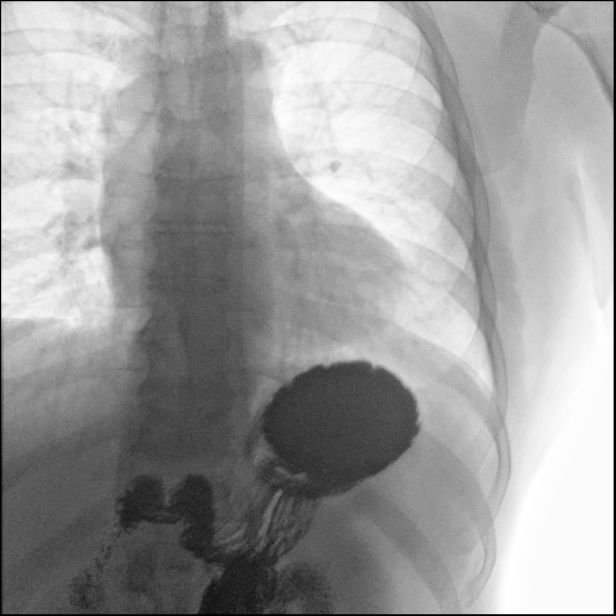

[Series 9: sequence · 2 of 29 frames shown (6 of 6)]
[frame 5/29]
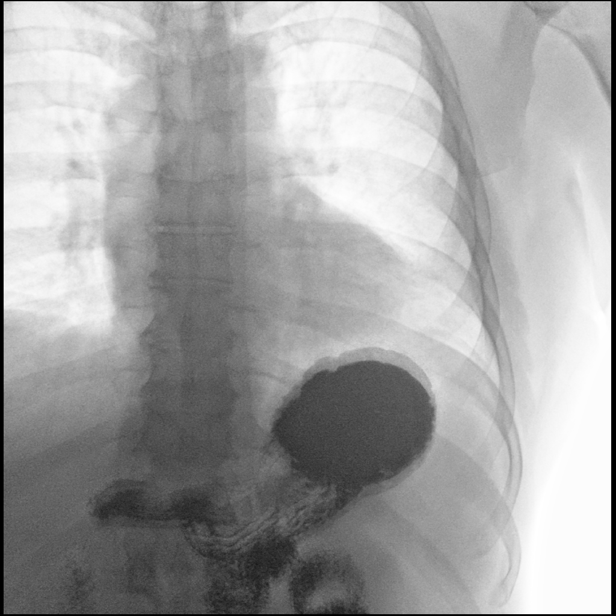
[frame 25/29]
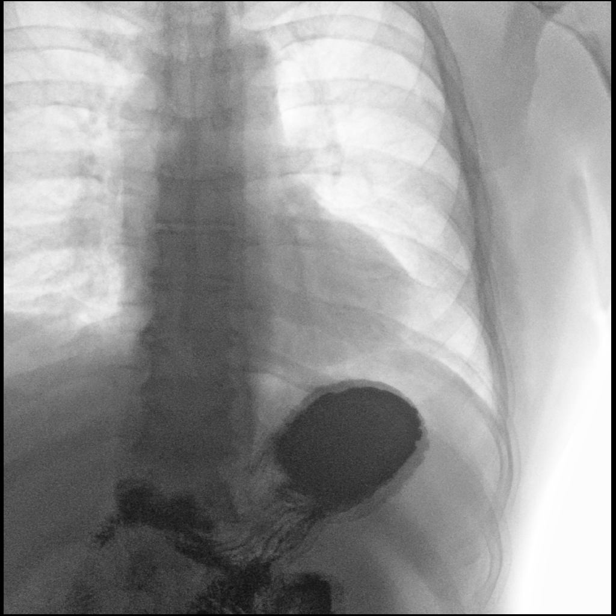

[14 of 24 positions shown; findings below may reference images not displayed]

FINDINGS: Scout Radiograph:  Normal.  Nonobstructive bowel gas pattern.

Esophagus: Patent.  No stricture or mass.

Esophageal motility: Within normal limits.

Gastroesophageal reflux: None visualized

Ingested 13mm barium tablet: Passed normally/Became stuck/Not given

Stomach: Normal appearance.

Hiatal Hernia: Tiny hiatal hernia noted.

Gastric emptying: Normal.

Duodenum: Normal appearance.

Other:  None.
IMPRESSION: 1. Tiny hiatal hernia.  No reflux visualized

## 2024-03-16 DIAGNOSIS — C801 Malignant (primary) neoplasm, unspecified: Secondary | ICD-10-CM

## 2024-03-16 HISTORY — DX: Malignant (primary) neoplasm, unspecified: C80.1

## 2024-03-31 ENCOUNTER — Other Ambulatory Visit: Payer: Self-pay | Admitting: General Surgery

## 2024-03-31 DIAGNOSIS — C4372 Malignant melanoma of left lower limb, including hip: Secondary | ICD-10-CM

## 2024-04-05 LAB — DERMATOLOGY PATHOLOGY

## 2024-04-07 ENCOUNTER — Ambulatory Visit: Payer: Self-pay | Admitting: General Surgery

## 2024-04-19 ENCOUNTER — Other Ambulatory Visit: Payer: Self-pay

## 2024-04-19 ENCOUNTER — Encounter (HOSPITAL_BASED_OUTPATIENT_CLINIC_OR_DEPARTMENT_OTHER): Payer: Self-pay | Admitting: General Surgery

## 2024-04-20 ENCOUNTER — Encounter (HOSPITAL_BASED_OUTPATIENT_CLINIC_OR_DEPARTMENT_OTHER)
Admission: RE | Admit: 2024-04-20 | Discharge: 2024-04-20 | Disposition: A | Discharge status: Home / Self Care | Source: Ambulatory Visit | Attending: General Surgery | Admitting: General Surgery

## 2024-04-20 ENCOUNTER — Other Ambulatory Visit: Payer: Self-pay

## 2024-04-20 DIAGNOSIS — Z01818 Encounter for other preprocedural examination: Secondary | ICD-10-CM | POA: Insufficient documentation

## 2024-04-20 LAB — COMPREHENSIVE METABOLIC PANEL WITH GFR
ALT: 21 U/L (ref 0–44)
AST: 20 U/L (ref 15–41)
Albumin: 4.1 g/dL (ref 3.5–5.0)
Alkaline Phosphatase: 36 U/L — ABNORMAL LOW (ref 38–126)
Anion gap: 8 (ref 5–15)
BUN: 12 mg/dL (ref 6–20)
CO2: 29 mmol/L (ref 22–32)
Calcium: 9.3 mg/dL (ref 8.9–10.3)
Chloride: 103 mmol/L (ref 98–111)
Creatinine, Ser: 0.84 mg/dL (ref 0.61–1.24)
GFR, Estimated: >60 mL/min (ref >60)
Glucose, Bld: 92 mg/dL (ref 70–99)
Potassium: 4.2 mmol/L (ref 3.5–5.1)
Sodium: 140 mmol/L (ref 135–145)
Total Bilirubin: 1 mg/dL (ref 0.0–1.2)
Total Protein: 6.6 g/dL (ref 6.5–8.1)

## 2024-04-20 LAB — CBC WITH DIFFERENTIAL/PLATELET
Abs Immature Granulocytes: 0.02 10*3/uL (ref 0.00–0.07)
Basophils Absolute: 0.1 10*3/uL (ref 0.0–0.1)
Basophils Relative: 1 %
Eosinophils Absolute: 0.1 10*3/uL (ref 0.0–0.5)
Eosinophils Relative: 1 %
HCT: 48.8 % (ref 39.0–52.0)
Hemoglobin: 16.7 g/dL (ref 13.0–17.0)
Immature Granulocytes: 0 %
Lymphocytes Relative: 33 %
Lymphs Abs: 2.6 10*3/uL (ref 0.7–4.0)
MCH: 31 pg (ref 26.0–34.0)
MCHC: 34.2 g/dL (ref 30.0–36.0)
MCV: 90.5 fL (ref 80.0–100.0)
Monocytes Absolute: 0.6 10*3/uL (ref 0.1–1.0)
Monocytes Relative: 8 %
Neutro Abs: 4.6 10*3/uL (ref 1.7–7.7)
Neutrophils Relative %: 57 %
Platelets: 229 10*3/uL (ref 150–400)
RBC: 5.39 MIL/uL (ref 4.22–5.81)
RDW: 12.7 % (ref 11.5–15.5)
WBC: 8 10*3/uL (ref 4.0–10.5)
nRBC: 0 % (ref 0.0–0.2)

## 2024-04-20 LAB — LACTATE DEHYDROGENASE: LDH: 170 U/L (ref 98–192)

## 2024-04-20 MED ORDER — CHLORHEXIDINE GLUCONATE CLOTH 2 % EX PADS: 6.0000 | MEDICATED_PAD | Freq: Once | CUTANEOUS | Status: DC

## 2024-04-20 NOTE — Progress Notes (Signed)

## 2024-04-25 NOTE — H&P (Signed)
 Chief Complaint  Patient presents with  New Consultation  melanoma on L shin   Subjective   Jared Michael is a 58 y.o. male who presents for New Consultation (melanoma on L shin) HPI History of Present Illness Jared Michael is a 58 year old male with melanoma who presents for surgical consultation regarding melanoma excision.  He has a lesion on his leg that initially appeared as a flat, mole-like spot, which turned purple and remained unchanged for over ten years. Approximately two years ago, it blistered and became raised. He described it looking like a blood blister. He did not really think it looked that different, but his primary care physician was concerned about it and biopsied it. It came back as melanoma. The area on the shin was a nodular melanoma of at least 1.5 mm depth with positive peripheral and deep margins. There was no microsatellitosis or lymphovascular invasion. There were 3 mitoses per millimeter squared. There was widespread ulceration seen. He was then sent to dermatology who did a skin survey and found another concerning lesion that was a melanoma on his back that was 0.4 mm.   He has not really been particularly out in the sun a significant mount over his lifetime. He does express some concern over a spot on his right shin that looks like something similar to what the left shin looks like at one point in its evolution around 5 to 7 years ago. The spot on his right shin went completely away and then came back as a brown plaque like lesion.   He works Financial controller.  He has no heart problems, history of stroke, or use of blood thinners.  Review of Systems  HENT: Positive for tinnitus.  All other systems reviewed and are negative.  Patient Active Problem List  Diagnosis  Malignant melanoma of skin of left lower extremity (CMS/HHS-HCC)  Melanoma of back (CMS/HHS-HCC)  Pigmented skin lesion of lower extremity   Outpatient Medications Prior to Visit  Medication  Sig Dispense Refill  hydroCHLOROthiazide  (HYDRODIURIL ) 12.5 MG tablet  lisinopriL (ZESTRIL) 2.5 MG tablet  rosuvastatin (CRESTOR) 10 MG tablet   No facility-administered medications prior to visit.     Objective   Vitals:  03/31/24 0948  BP: (!) 148/86  Pulse: 72  Temp: 36.8 C (98.3 F)  SpO2: 96%  Weight: 77.6 kg (171 lb)  Height: 170.2 cm (5\' 7" )  PainSc: 0-No pain  PainLoc: Leg   Body mass index is 26.78 kg/m.  Home Vitals:   Physical Exam Physical Exam SKIN: Melanoma present on leg and back, with the lesion on the leg thick enough to warrant sentinel lymph node biopsy.  Head: Normocephalic and atraumatic.  Mouth/Throat: Oropharynx is clear and moist. No oropharyngeal exudate.  Eyes: Conjunctivae are normal. Pupils are equal, round, and reactive to light. No scleral icterus.  Neck: Normal range of motion. Neck supple. No tracheal deviation present. No thyromegaly present.  Cardiovascular: Normal rate, regular rhythm, normal heart sounds and intact distal pulses. Exam reveals no gallop and no friction rub.  No murmur heard. Respiratory: Effort normal and breath sounds normal. No respiratory distress. No wheezes, rales or rhonchi. No chest wall tenderness.  GI: Soft. Bowel sounds are normal. Abdomen is soft, non tender, non distended. No masses or hepatosplenomegaly is present. There is no rebound and no guarding.  Musculoskeletal: . Extremities are non tender and without deformity.  Lymphadenopathy: No cervical or axillary adenopathy.  Neurological: Alert and oriented to person, place,  and time. Coordination normal.  Skin: Skin is warm and dry. No rash noted. No diaphoresis. No erythema. No pallor. Scar on upper medial left shin just below tibial tuberosity. Pigmented lesion with some scaling that looks like probable seborrheic keratosis on right shin. Scab on back just below lower border of scapula right of midline around 2 cm.  Psychiatric: Normal mood and  affect.Behavior is normal. Judgment and thought content normal.   Results Procedure: Punch biopsy Description: A small punch biopsy was performed on the lesion. A dissolvable stitch was placed to control bleeding.  PATHOLOGY Biopsy: Melanoma, 0.4 mm  Punch Biopsy Procedure Note  Pre-operative Diagnosis: Suspicious lesion  Locations:right shin  Indications: regressing lesion that came back similar to lesion that is now melanoma  Anesthesia: Lidocaine  1% with epinephrine   Patient informed of the risks (including bleeding and infection) and benefits of the  procedure and Verbal informed consent obtained.  Procedure Details: The lesion and surrounding area was given a sterile prep using chlorhexidine  . The lesion was removed using the 4mm punch. The resulting ellipse was then closed. The wound was closed with 4-0 Monocryl using simple interrupted stitches. Antibiotic ointment and a sterile dressing applied. The specimen was sent for pathologic examination. The patient tolerated the procedure well.  Complications:  None; patient tolerated the procedure well  Plan: 1. Instructed to keep the wound dry and covered for 24h and clean thereafter. 2. Warning signs of infection were reviewed.  3. Will plan suture removal at time of melanoma surgery  Assessment/Plan:   Assessment & Plan Malignant Melanoma of left shin and back Intermediate thickness leg melanoma requires sentinel lymph node biopsy due to potential groin spread. Back melanoma small, no lymph node biopsy needed. Wide local excision essential to prevent recurrence. Genetic counseling considered due to multiple melanomas. - Perform wide local excision of the leg melanoma with a 1-2 cm margin due to the sensitive location on the shin. - Perform sentinel lymph node biopsy for the leg melanoma, using technetium-99 and blue dye to identify lymph nodes in the groin. - Perform excision of the back melanoma without sentinel lymph  node biopsy. - Perform punch biopsy of the regressed lesion to rule out melanoma regression.  Discussed surgical procedure with patient and his spouse. I reviewed the process of surgery. I reviewed the injection of the sentinel node and the preop region. I reviewed blue dye placement. I reviewed that the shin would be tight and that this is a sensitive area for advancement flap closure. We discussed the back melanoma. The patient was set up for Mohs surgery on the back, but the patient desires to just have it done and not all 1 procedure while he is under anesthesia. This is not unreasonable. I discussed that this likely would lead to a larger scar on his back then would be possible with Mohs surgery.  I reviewed risks of surgery that would include almost certainly definite numbness at the wide local excision sites and possibly at the sentinel node site. I reviewed risk of seroma, bleeding, infection, wound breakdown, possible need for dressing changes, heart or lung complications, blood clot, recurrent melanoma. I reviewed that positive lymph node would lead to PET scan and oncology referral.  - Instruct to avoid strenuous activity and heavy lifting for a couple of weeks post-surgery. - Discuss potential referral to genetics counselor for evaluation due to multiple melanomas.  Diagnoses and all orders for this visit:  Pigmented skin lesion of lower extremity  Melanoma of back (CMS/HHS-HCC)  Malignant melanoma of skin of left lower extremity (CMS/HHS-HCC)

## 2024-04-26 ENCOUNTER — Encounter (HOSPITAL_BASED_OUTPATIENT_CLINIC_OR_DEPARTMENT_OTHER)
Admission: RE | Disposition: A | Discharge status: Home / Self Care | Payer: Self-pay | Source: Home / Self Care | Attending: General Surgery

## 2024-04-26 ENCOUNTER — Ambulatory Visit (HOSPITAL_BASED_OUTPATIENT_CLINIC_OR_DEPARTMENT_OTHER)
Admission: RE | Admit: 2024-04-26 | Discharge: 2024-04-26 | Disposition: A | Discharge status: Home / Self Care | Attending: General Surgery | Admitting: General Surgery

## 2024-04-26 ENCOUNTER — Other Ambulatory Visit: Payer: Self-pay

## 2024-04-26 ENCOUNTER — Encounter (HOSPITAL_BASED_OUTPATIENT_CLINIC_OR_DEPARTMENT_OTHER): Payer: Self-pay | Admitting: General Surgery

## 2024-04-26 ENCOUNTER — Ambulatory Visit (HOSPITAL_BASED_OUTPATIENT_CLINIC_OR_DEPARTMENT_OTHER): Admitting: Anesthesiology

## 2024-04-26 ENCOUNTER — Ambulatory Visit (HOSPITAL_COMMUNITY)
Admission: RE | Admit: 2024-04-26 | Discharge: 2024-04-26 | Disposition: A | Discharge status: Home / Self Care | Source: Ambulatory Visit | Attending: General Surgery | Admitting: General Surgery

## 2024-04-26 DIAGNOSIS — C4372 Malignant melanoma of left lower limb, including hip: Secondary | ICD-10-CM | POA: Insufficient documentation

## 2024-04-26 DIAGNOSIS — Z87891 Personal history of nicotine dependence: Secondary | ICD-10-CM | POA: Diagnosis not present

## 2024-04-26 DIAGNOSIS — L819 Disorder of pigmentation, unspecified: Secondary | ICD-10-CM

## 2024-04-26 DIAGNOSIS — C4359 Malignant melanoma of other part of trunk: Secondary | ICD-10-CM | POA: Insufficient documentation

## 2024-04-26 DIAGNOSIS — I10 Essential (primary) hypertension: Secondary | ICD-10-CM | POA: Diagnosis not present

## 2024-04-26 DIAGNOSIS — E785 Hyperlipidemia, unspecified: Secondary | ICD-10-CM | POA: Diagnosis not present

## 2024-04-26 HISTORY — PX: PROCEDURE, ADVANCEMENT FLAP, BACK: SHX7623

## 2024-04-26 HISTORY — PX: EXCISION MELANOMA WITH SENTINEL LYMPH NODE BIOPSY: SHX5628

## 2024-04-26 HISTORY — DX: Personal history of nicotine dependence: Z87.891

## 2024-04-26 HISTORY — DX: Prediabetes: R73.03

## 2024-04-26 HISTORY — DX: Hyperlipidemia, unspecified: E78.5

## 2024-04-26 HISTORY — DX: Essential (primary) hypertension: I10

## 2024-04-26 SURGERY — PROCEDURE, ADVANCEMENT FLAP, BACK
Anesthesia: General | Site: Leg Lower

## 2024-04-26 MED ORDER — LIDOCAINE HCL (CARDIAC) PF 100 MG/5ML IV SOSY
PREFILLED_SYRINGE | INTRAVENOUS | Status: DC | PRN
  Administered 2024-04-26: 80 mg via INTRAVENOUS

## 2024-04-26 MED ORDER — PROPOFOL 10 MG/ML IV BOLUS
INTRAVENOUS | Status: AC
  Filled 2024-04-26: qty 20

## 2024-04-26 MED ORDER — DEXAMETHASONE SODIUM PHOSPHATE 4 MG/ML IJ SOLN
INTRAMUSCULAR | Status: DC | PRN
  Administered 2024-04-26: 10 mg via INTRAVENOUS

## 2024-04-26 MED ORDER — HYDROMORPHONE HCL 1 MG/ML IJ SOLN: 0.2500 mg | INTRAMUSCULAR | Status: DC | PRN

## 2024-04-26 MED ORDER — SODIUM CHLORIDE 0.9 % IR SOLN
Status: DC | PRN
  Administered 2024-04-26: 150 mL

## 2024-04-26 MED ORDER — ACETAMINOPHEN 500 MG PO TABS
1000.0000 mg | ORAL_TABLET | ORAL | Status: AC
  Administered 2024-04-26: 1000 mg via ORAL

## 2024-04-26 MED ORDER — SODIUM CHLORIDE 0.9 % IV SOLN: 12.5000 mg | INTRAVENOUS | Status: DC | PRN

## 2024-04-26 MED ORDER — OXYCODONE HCL 5 MG/5ML PO SOLN: 5.0000 mg | Freq: Once | ORAL | Status: DC | PRN

## 2024-04-26 MED ORDER — TECHNETIUM TC 99M TILMANOCEPT KIT
0.5000 | PACK | Freq: Once | INTRAVENOUS | Status: AC | PRN
  Administered 2024-04-26: 0.5 via INTRADERMAL

## 2024-04-26 MED ORDER — LIDOCAINE-EPINEPHRINE (PF) 1 %-1:200000 IJ SOLN
INTRAMUSCULAR | Status: DC | PRN
  Administered 2024-04-26: 16 mL
  Administered 2024-04-26: 3 mL
  Administered 2024-04-26: 7.5 mL

## 2024-04-26 MED ORDER — LIDOCAINE 2% (20 MG/ML) 5 ML SYRINGE
INTRAMUSCULAR | Status: AC
  Filled 2024-04-26: qty 5

## 2024-04-26 MED ORDER — AMISULPRIDE (ANTIEMETIC) 5 MG/2ML IV SOLN: 10.0000 mg | Freq: Once | INTRAVENOUS | Status: DC | PRN

## 2024-04-26 MED ORDER — SUGAMMADEX SODIUM 200 MG/2ML IV SOLN
INTRAVENOUS | Status: DC | PRN
  Administered 2024-04-26: 200 mg via INTRAVENOUS

## 2024-04-26 MED ORDER — FENTANYL CITRATE (PF) 100 MCG/2ML IJ SOLN
INTRAMUSCULAR | Status: AC
  Filled 2024-04-26: qty 2

## 2024-04-26 MED ORDER — FENTANYL CITRATE (PF) 100 MCG/2ML IJ SOLN
INTRAMUSCULAR | Status: AC
Start: 2024-04-26 — End: 2024-04-26
  Filled 2024-04-26: qty 2

## 2024-04-26 MED ORDER — MIDAZOLAM HCL 2 MG/2ML IJ SOLN
INTRAMUSCULAR | Status: AC
  Filled 2024-04-26: qty 2

## 2024-04-26 MED ORDER — EPHEDRINE SULFATE (PRESSORS) 50 MG/ML IJ SOLN
INTRAMUSCULAR | Status: DC | PRN
  Administered 2024-04-26 (×2): 5 mg via INTRAVENOUS
  Administered 2024-04-26: 10 mg via INTRAVENOUS

## 2024-04-26 MED ORDER — LIDOCAINE-EPINEPHRINE (PF) 1 %-1:200000 IJ SOLN
INTRAMUSCULAR | Status: AC
  Filled 2024-04-26: qty 30

## 2024-04-26 MED ORDER — ACETAMINOPHEN 500 MG PO TABS
ORAL_TABLET | ORAL | Status: AC
  Filled 2024-04-26: qty 2

## 2024-04-26 MED ORDER — ONDANSETRON HCL 4 MG/2ML IJ SOLN
INTRAMUSCULAR | Status: AC
  Filled 2024-04-26: qty 2

## 2024-04-26 MED ORDER — EPHEDRINE 5 MG/ML INJ
INTRAVENOUS | Status: AC
  Filled 2024-04-26: qty 5

## 2024-04-26 MED ORDER — BUPIVACAINE HCL (PF) 0.25 % IJ SOLN
INTRAMUSCULAR | Status: AC
  Filled 2024-04-26: qty 30

## 2024-04-26 MED ORDER — MEPERIDINE HCL 25 MG/ML IJ SOLN: 6.2500 mg | INTRAMUSCULAR | Status: DC | PRN

## 2024-04-26 MED ORDER — ROCURONIUM BROMIDE 100 MG/10ML IV SOLN
INTRAVENOUS | Status: DC | PRN
  Administered 2024-04-26: 20 mg via INTRAVENOUS
  Administered 2024-04-26: 80 mg via INTRAVENOUS

## 2024-04-26 MED ORDER — FENTANYL CITRATE (PF) 100 MCG/2ML IJ SOLN
INTRAMUSCULAR | Status: DC | PRN
  Administered 2024-04-26 (×3): 50 ug via INTRAVENOUS
  Administered 2024-04-26: 100 ug via INTRAVENOUS
  Administered 2024-04-26: 50 ug via INTRAVENOUS

## 2024-04-26 MED ORDER — CEFAZOLIN SODIUM-DEXTROSE 2-4 GM/100ML-% IV SOLN
2.0000 g | INTRAVENOUS | Status: AC
  Administered 2024-04-26: 2 g via INTRAVENOUS

## 2024-04-26 MED ORDER — DEXAMETHASONE SODIUM PHOSPHATE 10 MG/ML IJ SOLN
INTRAMUSCULAR | Status: AC
  Filled 2024-04-26: qty 1

## 2024-04-26 MED ORDER — LACTATED RINGERS IV SOLN: INTRAVENOUS | Status: DC

## 2024-04-26 MED ORDER — METHYLENE BLUE (ANTIDOTE) 1 % IV SOLN
INTRAVENOUS | Status: AC
  Filled 2024-04-26: qty 10

## 2024-04-26 MED ORDER — CEFAZOLIN SODIUM-DEXTROSE 2-4 GM/100ML-% IV SOLN
INTRAVENOUS | Status: AC
  Filled 2024-04-26: qty 100

## 2024-04-26 MED ORDER — ONDANSETRON HCL 4 MG/2ML IJ SOLN
INTRAMUSCULAR | Status: DC | PRN
  Administered 2024-04-26: 4 mg via INTRAVENOUS

## 2024-04-26 MED ORDER — METHYLENE BLUE (ANTIDOTE) 1 % IV SOLN
INTRAVENOUS | Status: DC | PRN
Start: 2024-04-26 — End: 2024-04-26
  Administered 2024-04-26: 1 mL via SUBMUCOSAL

## 2024-04-26 MED ORDER — OXYCODONE HCL 5 MG PO TABS: 5.0000 mg | ORAL_TABLET | Freq: Once | ORAL | Status: DC | PRN

## 2024-04-26 MED ORDER — OXYCODONE HCL 5 MG PO TABS: 5.0000 mg | ORAL_TABLET | Freq: Four times a day (QID) | ORAL | 0 refills | Status: AC | PRN

## 2024-04-26 MED ORDER — MIDAZOLAM HCL 5 MG/5ML IJ SOLN
INTRAMUSCULAR | Status: DC | PRN
  Administered 2024-04-26: 2 mg via INTRAVENOUS

## 2024-04-26 MED ORDER — PROPOFOL 10 MG/ML IV BOLUS
INTRAVENOUS | Status: DC | PRN
  Administered 2024-04-26: 40 mg via INTRAVENOUS
  Administered 2024-04-26: 160 mg via INTRAVENOUS

## 2024-04-26 MED ORDER — ROCURONIUM BROMIDE 10 MG/ML (PF) SYRINGE
PREFILLED_SYRINGE | INTRAVENOUS | Status: AC
  Filled 2024-04-26: qty 10

## 2024-04-26 SURGICAL SUPPLY — 64 items
BENZOIN TINCTURE PRP APPL 2/3 (GAUZE/BANDAGES/DRESSINGS) ×2 IMPLANT
BLADE CLIPPER SURG (BLADE) ×1 IMPLANT
BLADE HEX COATED 2.75 (ELECTRODE) ×2 IMPLANT
BLADE SURG 10 STRL SS (BLADE) ×3 IMPLANT
BLADE SURG 15 STRL LF DISP TIS (BLADE) ×3 IMPLANT
BNDG COHESIVE 4X5 TAN STRL LF (GAUZE/BANDAGES/DRESSINGS) ×1 IMPLANT
BNDG GAUZE DERMACEA FLUFF 4 (GAUZE/BANDAGES/DRESSINGS) IMPLANT
CANISTER SUCT 1200ML W/VALVE (MISCELLANEOUS) ×3 IMPLANT
CHLORAPREP W/TINT 26 (MISCELLANEOUS) ×5 IMPLANT
CLIP APPLIE 11 MED OPEN (CLIP) IMPLANT
CLIP APPLIE 9.375 MED OPEN (MISCELLANEOUS) IMPLANT
CLIP TI LARGE 6 (CLIP) IMPLANT
CLIP TI MEDIUM 6 (CLIP) ×5 IMPLANT
CLIP TI WIDE RED SMALL 6 (CLIP) ×3 IMPLANT
COVER MAYO STAND STRL (DRAPES) ×1 IMPLANT
COVER PROBE CYLINDRICAL 5X96 (MISCELLANEOUS) ×3 IMPLANT
DERMABOND ADVANCED .7 DNX12 (GAUZE/BANDAGES/DRESSINGS) ×1 IMPLANT
DRAPE IMP U-DRAPE 54X76 (DRAPES) IMPLANT
DRAPE LAPAROSCOPIC ABDOMINAL (DRAPES) ×1 IMPLANT
DRAPE U-SHAPE 76X120 STRL (DRAPES) ×2 IMPLANT
DRAPE UTILITY XL STRL (DRAPES) ×6 IMPLANT
DRSG TEGADERM 4X10 (GAUZE/BANDAGES/DRESSINGS) IMPLANT
DRSG TEGADERM 4X4.75 (GAUZE/BANDAGES/DRESSINGS) ×4 IMPLANT
ELECTRODE REM PT RTRN 9FT ADLT (ELECTROSURGICAL) ×3 IMPLANT
GAUZE PAD ABD 8X10 STRL (GAUZE/BANDAGES/DRESSINGS) IMPLANT
GAUZE SPONGE 4X4 12PLY STRL (GAUZE/BANDAGES/DRESSINGS) ×1 IMPLANT
GAUZE SPONGE 4X4 12PLY STRL LF (GAUZE/BANDAGES/DRESSINGS) IMPLANT
GLOVE BIO SURGEON STRL SZ 6 (GLOVE) ×4 IMPLANT
GLOVE BIO SURGEON STRL SZ 6.5 (GLOVE) ×1 IMPLANT
GLOVE BIOGEL PI IND STRL 6.5 (GLOVE) ×4 IMPLANT
GLOVE BIOGEL PI IND STRL 7.0 (GLOVE) ×3 IMPLANT
GLOVE SURG SS PI 7.0 STRL IVOR (GLOVE) ×1 IMPLANT
GOWN STRL REUS W/ TWL LRG LVL3 (GOWN DISPOSABLE) ×4 IMPLANT
GOWN STRL REUS W/ TWL XL LVL3 (GOWN DISPOSABLE) ×4 IMPLANT
NDL HYPO 25X1 1.5 SAFETY (NEEDLE) ×4 IMPLANT
NDL SAFETY ECLIPSE 18X1.5 (NEEDLE) ×3 IMPLANT
NEEDLE HYPO 25X1 1.5 SAFETY (NEEDLE) ×6 IMPLANT
NS IRRIG 1000ML POUR BTL (IV SOLUTION) ×1 IMPLANT
PACK BASIN DAY SURGERY FS (CUSTOM PROCEDURE TRAY) ×3 IMPLANT
PACK UNIVERSAL I (CUSTOM PROCEDURE TRAY) ×3 IMPLANT
PENCIL SMOKE EVACUATOR (MISCELLANEOUS) ×3 IMPLANT
POWDER MYRIAD MORCLLS FINE 500 (Miscellaneous) ×1 IMPLANT
SLEEVE SCD COMPRESS KNEE MED (STOCKING) ×3 IMPLANT
SLING ARM FOAM STRAP LRG (SOFTGOODS) IMPLANT
SPIKE FLUID TRANSFER (MISCELLANEOUS) IMPLANT
SPONGE T-LAP 18X18 ~~LOC~~+RFID (SPONGE) ×2 IMPLANT
STOCKINETTE IMPERVIOUS LG (DRAPES) ×1 IMPLANT
STRIP CLOSURE SKIN 1/2X4 (GAUZE/BANDAGES/DRESSINGS) ×1 IMPLANT
SUT ETHILON 2 0 FS 18 (SUTURE) IMPLANT
SUT ETHILON 2 0 FSLX (SUTURE) ×4 IMPLANT
SUT MNCRL AB 4-0 PS2 18 (SUTURE) ×2 IMPLANT
SUT MON AB 4-0 PC3 18 (SUTURE) ×2 IMPLANT
SUT SILK 2 0 SH (SUTURE) ×1 IMPLANT
SUT VIC AB 2-0 SH 27XBRD (SUTURE) ×5 IMPLANT
SUT VIC AB 3-0 SH 27X BRD (SUTURE) ×3 IMPLANT
SUT VICRYL 3-0 CR8 SH (SUTURE) ×1 IMPLANT
SYR 5ML LUER SLIP (SYRINGE) ×1 IMPLANT
SYR BULB EAR ULCER 3OZ GRN STR (SYRINGE) ×3 IMPLANT
SYR CONTROL 10ML LL (SYRINGE) ×3 IMPLANT
SYR TB 1ML LL NO SAFETY (SYRINGE) ×2 IMPLANT
TOWEL GREEN STERILE FF (TOWEL DISPOSABLE) ×4 IMPLANT
TUBE CONNECTING 20X1/4 (TUBING) ×3 IMPLANT
UNDERPAD 30X36 HEAVY ABSORB (UNDERPADS AND DIAPERS) ×2 IMPLANT
YANKAUER SUCT BULB TIP NO VENT (SUCTIONS) ×3 IMPLANT

## 2024-04-26 NOTE — Transfer of Care (Signed)
 Immediate Anesthesia Transfer of Care Note  Patient: Jared Michael  Procedure(s) Performed: Procedure(s) (LRB): WIDE EXCISION WITH ADVANCEMENT FLAP CLOSURE ON BACK (N/A) WIDE EXCISION WITH ADVANCEMENT FLAP CLOSURE LEFT SHIN MELANOMA AND LEFT GROIN SENTINEL LYMPH NODE BIOPSY (Left)  Patient Location: PACU  Anesthesia Type: GA  Level of Consciousness: awake, sedated, patient cooperative and responds to stimulation, sleepy stable   Airway & Oxygen Therapy: Patient Spontanous Breathing and Patient connected to [place] oxygen  Post-op Assessment: Report given to PACU RN, Post -op Vital signs reviewed and stable and Patient sleepy   Post vital signs: Reviewed and stable  Complications: No apparent anesthesia complications

## 2024-04-26 NOTE — Progress Notes (Signed)
 RN at bedside for Nuclear Med injections on left shin, marked by Dr. Cherlynn Cornfield.  Pt tolerated procedure well.

## 2024-04-26 NOTE — Discharge Instructions (Addendum)
 [facility] Office Phone Number [phone]   POST OP INSTRUCTIONS  Always review your discharge instruction sheet given to you by the facility where your surgery was performed.  IF YOU HAVE DISABILITY OR FAMILY LEAVE FORMS, YOU MUST BRING THEM TO THE OFFICE FOR PROCESSING.  DO NOT GIVE THEM TO YOUR DOCTOR.  Take 2 tylenol  (acetominophen) three times a day for 3 days.  If you still have pain, add ibuprofen with food in between if able to take this (if you have kidney issues or stomach issues, do not take ibuprofen).  If both of those are not enough, add the narcotic pain pill.  If you find you are needing a lot of this overnight after surgery, call the next morning for a refill.   Take your usually prescribed medications unless otherwise directed If you need a refill on your pain medication, please contact your pharmacy.  They will contact our office to request authorization.  Prescriptions will not be filled after 5pm or on week-ends. You should eat very light the first 24 hours after surgery, such as soup, crackers, pudding, etc.  Resume your normal diet the day after surgery It is common to experience some constipation if taking pain medication after surgery.  Increasing fluid intake and taking a stool softener will usually help or prevent this problem from occurring.  A mild laxative (Milk of Magnesia or Miralax) should be taken according to package directions if there are no bowel movements after 48 hours. You may shower in 48 hours.  You can remove the outer dressings after the shower and redress if desired.  The surgical glue will flake off in 2-3 weeks. The black sutures will be removed at the post op visit.     ACTIVITIES:  No strenuous activity or heavy lifting until cleared by physician.   You may drive when you no longer are taking prescription pain medication, you can comfortably wear a seatbelt, and you can safely maneuver your car and apply brakes. RETURN TO WORK:   __________as tolerated if no excessive standing, walking, or activity/lifting for the time period above. _______________ Elene Griffes should see your doctor in the office for a follow-up appointment approximately three-four weeks after your surgery.    WHEN TO CALL YOUR DOCTOR: Fever over 101.0 Nausea and/or vomiting. Extreme swelling or bruising. Continued bleeding from incision. Increased pain, redness, or drainage from the incision.  The clinic staff is available to answer your questions during regular business hours.  Please don't hesitate to call and ask to speak to one of the nurses for clinical concerns.  If you have a medical emergency, go to the nearest emergency room or call 911.  A surgeon from [facility] is always on call at the hospital.  For further questions, please visit [email or web address]    Post Anesthesia Home Care Instructions  Activity: Get plenty of rest for the remainder of the day. A responsible individual must stay with you for 24 hours following the procedure.  For the next 24 hours, DO NOT: -Drive a car -Advertising copywriter -Drink alcoholic beverages -Take any medication unless instructed by your physician -Make any legal decisions or sign important papers.  Meals: Start with liquid foods such as gelatin or soup. Progress to regular foods as tolerated. Avoid greasy, spicy, heavy foods. If nausea and/or vomiting occur, drink only clear liquids until the nausea and/or vomiting subsides. Call your physician if vomiting continues.  Special Instructions/Symptoms: Your throat may feel dry or sore from the  anesthesia or the breathing tube placed in your throat during surgery. If this causes discomfort, gargle with warm salt water. The discomfort should disappear within 24 hours.  If you had a scopolamine patch placed behind your ear for the management of post- operative nausea and/or vomiting:  1. The medication in the patch is effective for 72 hours,  after which it should be removed.  Wrap patch in a tissue and discard in the trash. Wash hands thoroughly with soap and water. 2. You may remove the patch earlier than 72 hours if you experience unpleasant side effects which may include dry mouth, dizziness or visual disturbances. 3. Avoid touching the patch. Wash your hands with soap and water after contact with the patch.  No tylenol  until after 1:30 today if needed.

## 2024-04-26 NOTE — Anesthesia Postprocedure Evaluation (Signed)
 Anesthesia Post Note  Patient: Jared Michael  Procedure(s) Performed: WIDE EXCISION WITH ADVANCEMENT FLAP CLOSURE ON BACK (Back) WIDE EXCISION WITH ADVANCEMENT FLAP CLOSURE LEFT SHIN MELANOMA AND LEFT GROIN SENTINEL LYMPH NODE BIOPSY (Left: Leg Lower)     Patient location during evaluation: PACU Anesthesia Type: General Level of consciousness: awake and alert Pain management: pain level controlled Vital Signs Assessment: post-procedure vital signs reviewed and stable Respiratory status: spontaneous breathing, nonlabored ventilation and respiratory function stable Cardiovascular status: blood pressure returned to baseline and stable Postop Assessment: no apparent nausea or vomiting Anesthetic complications: no   No notable events documented.  Last Vitals:  Vitals:   04/26/24 1245 04/26/24 1311  BP: (!) 144/93 (!) 147/88  Pulse: (!) 101 85  Resp: 18 16  Temp:  (!) 36.2 C  SpO2: 98% 94%    Last Pain:  Vitals:   04/26/24 1311  TempSrc:   PainSc: 0-No pain                 Earvin Goldberg

## 2024-04-26 NOTE — Interval H&P Note (Signed)
 History and Physical Interval Note:  04/26/2024 8:44 AM  Jared Michael  has presented today for surgery, with the diagnosis of MELANOMA ON BACK MELANOMA LEFT LOWER EXTREMITY PIGMENTED SKIN LESION.  The various methods of treatment have been discussed with the patient and family. After consideration of risks, benefits and other options for treatment, the patient has consented to  Procedure(s) with comments: PROCEDURE, ADVANCEMENT FLAP, BACK (Left) - Wide local excision and advancement flap closure left shin melanoma Wide local excision and advancement flap closure back melanoma BIOPSY, LYMPH NODE, SENTINEL (Left) - Sentinal lymph node biopsy left groin as a surgical intervention.  The patient's history has been reviewed, patient examined, no change in status, stable for surgery.  I have reviewed the patient's chart and labs.  Questions were answered to the patient's satisfaction.     Lockie Rima

## 2024-04-26 NOTE — Anesthesia Preprocedure Evaluation (Addendum)
 Anesthesia Evaluation  Patient identified by MRN, date of birth, ID band Patient awake    Reviewed: Allergy & Precautions, H&P , NPO status , Patient's Chart, lab work & pertinent test results  Airway Mallampati: II  TM Distance: >3 FB Neck ROM: Full    Dental no notable dental hx.    Pulmonary neg pulmonary ROS   Pulmonary exam normal breath sounds clear to auscultation       Cardiovascular hypertension, Pt. on medications Normal cardiovascular exam Rhythm:Regular Rate:Normal     Neuro/Psych negative neurological ROS  negative psych ROS   GI/Hepatic negative GI ROS, Neg liver ROS,,,  Endo/Other  negative endocrine ROS    Renal/GU negative Renal ROS  negative genitourinary   Musculoskeletal negative musculoskeletal ROS (+)    Abdominal   Peds negative pediatric ROS (+)  Hematology negative hematology ROS (+)   Anesthesia Other Findings Melanoma  Reproductive/Obstetrics negative OB ROS                             Anesthesia Physical Anesthesia Plan  ASA: 3  Anesthesia Plan: General   Post-op Pain Management: Tylenol  PO (pre-op)*   Induction: Intravenous  PONV Risk Score and Plan: 2 and Ondansetron , Midazolam and Treatment may vary due to age or medical condition  Airway Management Planned: Oral ETT  Additional Equipment:   Intra-op Plan:   Post-operative Plan: Extubation in OR  Informed Consent: I have reviewed the patients History and Physical, chart, labs and discussed the procedure including the risks, benefits and alternatives for the proposed anesthesia with the patient or authorized representative who has indicated his/her understanding and acceptance.     Dental advisory given  Plan Discussed with: CRNA  Anesthesia Plan Comments:        Anesthesia Quick Evaluation

## 2024-04-26 NOTE — Op Note (Signed)
 PRE-OPERATIVE DIAGNOSIS: cT1aN0 back right upper back melanoma, cT2N0 left shin melanoma  POST-OPERATIVE DIAGNOSIS:  Same  PROCEDURE:  Procedure(s): Wide local excision back melanoma, advancement flap closure for defect 7.1 x 4 cm, wide local excision left shin melanoma with advancement flap closure 7.3 x 3.6 cm defect, left inguinal sentinel lymph node mapping and biopsy  SURGEON:  Surgeon(s): Lockie Rima, MD  ASSIST:  Loura Rower, PA-C  ANESTHESIA:   local and general  DRAINS: none   LOCAL MEDICATIONS USED:  MARCAINE    and XYLOCAINE    SPECIMEN:  Source of Specimen:  wide local excision right mid upper back melanoma, wide local excision left shin melanoma, two left inguinal sentinel lymph node(s)   FINDINGS:  no gross residual disease, two blue/hot left inguinal node(s)  DISPOSITION OF SPECIMEN:  PATHOLOGY  COUNTS:  YES  PLAN OF CARE: Discharge to home after PACU  PATIENT DISPOSITION:  PACU - hemodynamically stable.    PROCEDURE:   Pt was identified in the holding area, taken to the OR, and placed supine on the OR table.  General anesthesia was induced.  Time out was performed according to the surgical safety checklist.  When all was correct, we continued.    The patient was placed into the prone position with appropriate padding.  The upper mid back  was clipped, prepped and draped in sterile fashion.  The melanoma was identified and 1 cm margins were marked out.  Local was administered under the melanoma and the adjacent tissue.  A #10 blade was used to incise the skin around the melanoma.  The cautery was used to take the dissection down to the fascia.  The skin was marked in situ with orientation sutures.  The cautery was used to take the specimen off the fascia, and it was passed off the table.    Skin hooks were used to elevate the edges of the incision and the skin was freed up in all directions with the cautery to crate advancement flaps.  This was pulled together in a  transverse orientation. The skin was pulled together to check the tension.  Deep interrupted 2-0 vicryl sutures were placed to relieve tension.  The skin was then reapproximated with 3-0 interrupted vicryl deep dermal sutures and 4-0 monocryl running subcuticular sutures.  Three 2-0 nylon horizontal mattress sutures were placed as well.  This was dressed with benzoin, steristrips, gauze, and tegaderm  The patient was flipped back supine.  The patient's left leg and groin were prepped and draped in sterile fashion.  One mL methylene blue was injected intradermally around the melanoma biopsy site.  The melanoma was identified and 1 cm margins were marked out. Normally I would do 2 cm but due to the location at the shin with minimal give of the adjacent tissue, I did one cm.  Local was administered under the melanoma and the adjacent tissue.  A #10 blade was used to incise the skin around the melanoma.  The cautery was used to take the dissection down to the fascia.  The skin was marked in situ with orientation sutures.  The cautery was used to take the specimen off the fascia, and it was passed off the table. Skin hooks were used to elevate the edges of the incision and the skin was freed up in all directions with the cautery to crate advancement flaps.  This was pulled together in a longitudinal orientation. 500 mg fine myriad morcells were placed in the cavity The skin was  pulled together to check the tension.  Deep interrupted 2-0 vicryl sutures were placed to relieve tension.  The skin was then reapproximated 4-0 monocryl running subcuticular suture. Three 2-0 nylon horizontal mattress sutures were placed as well.  This was dressed with benzoin, steristrips, gauze, and tegaderm.   The groin was then addressed.  The point of maximum signal intensity was identified with the neoprobe.  A 4 cm incision was made with a #15 blade.  The subcutaneous tissues were divided with the cautery.  A Weitlaner retractor was  used to assist with visualization.  The tonsil clamp was used to bluntly dissect the  fat pad.  Two blue and hot sentinel lymph nodes were identified as described above.  The lymphovascular channels were clipped with hemoclips.  The nodes were passed off as specimens.  Hemostasis was achieved with the cautery.  The wound was irrigated and closed with 3-0 Vicryl deep dermal interrupted sutures and 4-0 Monocryl running subcuticular suture.  The incision was dressed with dermabond.    Needle, sponge, and instrument counts were correct.  The patient was awakened from anesthesia and taken to the PACU in stable condition.

## 2024-04-26 NOTE — Anesthesia Procedure Notes (Signed)
 Procedure Name: Intubation Date/Time: 04/26/2024 9:17 AM  Performed by: Glorya Larsson, CRNAPre-anesthesia Checklist: Patient identified, Emergency Drugs available, Suction available and Patient being monitored Patient Re-evaluated:Patient Re-evaluated prior to induction Oxygen Delivery Method: Circle System Utilized Preoxygenation: Pre-oxygenation with 100% oxygen Induction Type: IV induction Ventilation: Mask ventilation without difficulty and Oral airway inserted - appropriate to patient size Laryngoscope Size: Mac and 4 Grade View: Grade I Tube type: Oral Tube size: 7.5 mm Number of attempts: 1 Airway Equipment and Method: Stylet and Oral airway Placement Confirmation: ETT inserted through vocal cords under direct vision, positive ETCO2 and breath sounds checked- equal and bilateral Secured at: 23 cm Tube secured with: Tape Dental Injury: Teeth and Oropharynx as per pre-operative assessment

## 2024-04-27 ENCOUNTER — Encounter (HOSPITAL_BASED_OUTPATIENT_CLINIC_OR_DEPARTMENT_OTHER): Payer: Self-pay | Admitting: General Surgery

## 2024-05-03 ENCOUNTER — Ambulatory Visit: Payer: Self-pay | Admitting: General Surgery

## 2024-05-03 LAB — SURGICAL PATHOLOGY

## 2024-05-29 NOTE — Progress Notes (Signed)
 PROVIDER:  JINA CLAIR NEPHEW, MD [name], [name]  MRN: [id number] DOB: 1966-04-11 DATE OF ENCOUNTER: 05/29/2024 Initial History:    He has a lesion on his leg that initially appeared as a flat, mole-like spot, which turned purple and remained unchanged for over ten years. Approximately two years ago, it blistered and became raised.  He described it looking like a blood blister.  He did not really think it looked that different, but his primary care physician was concerned about it and biopsied it.  It came back as melanoma.  The area on the shin was a nodular melanoma of at least 1.5 mm depth with positive peripheral and deep margins.  There was no microsatellitosis or lymphovascular invasion.  There were 3 mitoses per millimeter squared.  There was widespread ulceration seen.  He was then sent to dermatology who did a skin survey and found another concerning lesion that was a melanoma on his back that was 0.4 mm.     He has not really been particularly out in the sun a significant mount over his lifetime.  He does express some concern over a spot on his right shin that looks like something similar to what the left shin looks like at one point in its evolution around 5 to 7 years ago.  The spot on his right shin went completely away and then came back as a brown plaque like lesion.    He works [employer].  Interval History:   Patient underwent wide local excision of back melanoma with advancement flap closure as well as wide local excision of left shin melanoma with advancement flap closure as well as left inguinal sentinel lymph node biopsy April 26, 2024.  The back melanoma was less than a millimeter originally and remained less than a millimeter.  The final stage on the shin melanoma is pT2b N0.  History of Present Illness Jared Michael is a 58 year old male who presents with postoperative leg swelling and pain following  outpatient surgery.  Patient states the seroma came back almost immediately that night.  He did take the doxycycline and started developing hives around 5 days later  He continued to have lower extremity swelling. I drained 25 mL from groin incision that next week.   This week he doesn't have any reaccumulation of the fluid.  He has been wrapping the leg and it is doing better but is still tight.     Pathology 04/26/2024 A.   SKIN, LEFT MID BACK, EXCISION: RESIDUAL MALIGNANT MELANOMA, 0.3 MM IN EXCISION SPECIMEN, MARGINS FREE OF MELANOMA: NOTE PRIOR STAGE MELANOMA NOT KNOWN COMMENT: Sections show biopsy site changes and focal residual melanoma in situ and invasive melanoma extending to 0.3 mm in the dermis. MelanA was used to evaluate residual melanoma. The prior stage of this melanoma is not known; no prior report is submitted. However, the margins are free of melanoma. B.   SKIN, LEFT SHIN, EXCISION: RESIDUAL MELANOMA IN SITU, MARGINS FREE OF MELANOMA, STAGE UNCHANGED FROM PT2B NX M0 MELANOMA COMMENT: Sections show residual melanoma in situ and no residual invasive disease. Prior case report from [facility] ([id number]) is obtained with a prior pT2b 1.5 mm melanoma. The stage of this melanoma is therefore unchanged from the [facility] report. MelanA and MITF were used to evaluate selected slides.  C.   LYMPH NODE, LEFT GROIN, SENTINEL, EXCISION: 0/1 SENTINEL LYMPH NODE, NEGATIVE FOR MELANOMA COMMENT: One lymph  node is evaluated by H and E, HMB-45, MelanA, and Sox-10 and is negative for melanoma. This is one lymph node (0/1), negative for melanoma.   Physical Examination:   Left shin:no redness.  Still some edema overall but continued improvement.   Left groin:  no seroma.    Assessment and Plan:       Diagnoses and all orders for this visit:  Malignant melanoma of skin of left lower extremity (CMS/HHS-HCC)  Melanoma of back (CMS/HHS-HCC)  Pigmented skin lesion of lower  extremity      Assessment & Plan Seroma of groin Resolved.  Postoperative wound infection Resolved, no evidence of recurrence.  Advised to continue elevating leg and wrapping when able.   Patient has dermatology follow-up in August and is planning 43-month follow-ups for the next year for melanoma skin survey.  I will see the patient for wound check in 3 months and then I likely will go to annual follow-up given the frequency of dermatology follow-up.       Return in about 3 months (around 08/29/2024), or if symptoms worsen or fail to improve, for melanoma follow up.   The plan was discussed in detail with the patient today, who expressed understanding.  The patient has my contact information, and understands to call me with any additional questions or concerns in the interval.  I would be happy to see the patient back sooner if the need arises.   JINA CLAIR NEPHEW, MD
